# Patient Record
Sex: Female | Born: 1945
Health system: Southern US, Community
[De-identification: ages and names within clinical notes are randomized; demographics above are authoritative.]

## PROBLEM LIST (undated history)

## (undated) DIAGNOSIS — Z79811 Long term (current) use of aromatase inhibitors: Secondary | ICD-10-CM

## (undated) DIAGNOSIS — Z9221 Personal history of antineoplastic chemotherapy: Secondary | ICD-10-CM

## (undated) DIAGNOSIS — Z923 Personal history of irradiation: Secondary | ICD-10-CM

## (undated) DIAGNOSIS — C50919 Malignant neoplasm of unspecified site of unspecified female breast: Secondary | ICD-10-CM

## (undated) DIAGNOSIS — R05 Cough: Secondary | ICD-10-CM

## (undated) DIAGNOSIS — R059 Cough, unspecified: Secondary | ICD-10-CM

## (undated) DIAGNOSIS — M1990 Unspecified osteoarthritis, unspecified site: Secondary | ICD-10-CM

## (undated) DIAGNOSIS — K219 Gastro-esophageal reflux disease without esophagitis: Secondary | ICD-10-CM

## (undated) DIAGNOSIS — E785 Hyperlipidemia, unspecified: Secondary | ICD-10-CM

## (undated) HISTORY — DX: Long term (current) use of aromatase inhibitors: Z79.811

## (undated) HISTORY — PX: BREAST BIOPSY: SHX20

## (undated) HISTORY — DX: Personal history of irradiation: Z92.3

## (undated) HISTORY — DX: Cough: R05

## (undated) HISTORY — PX: OTHER SURGICAL HISTORY: SHX169

## (undated) HISTORY — DX: Hyperlipidemia, unspecified: E78.5

## (undated) HISTORY — PX: OOPHORECTOMY: SHX86

## (undated) HISTORY — PX: CHOLECYSTECTOMY: SHX55

## (undated) HISTORY — DX: Gastro-esophageal reflux disease without esophagitis: K21.9

## (undated) HISTORY — DX: Unspecified osteoarthritis, unspecified site: M19.90

## (undated) HISTORY — PX: ERCP: SHX60

## (undated) HISTORY — DX: Cough, unspecified: R05.9

## (undated) HISTORY — DX: Malignant neoplasm of unspecified site of unspecified female breast: C50.919

---

## 2000-02-15 ENCOUNTER — Other Ambulatory Visit: Admission: RE | Admit: 2000-02-15 | Discharge: 2000-02-15 | Payer: Self-pay | Admitting: Gynecology

## 2000-08-31 ENCOUNTER — Encounter (INDEPENDENT_AMBULATORY_CARE_PROVIDER_SITE_OTHER): Payer: Self-pay | Admitting: Specialist

## 2000-08-31 ENCOUNTER — Observation Stay (HOSPITAL_COMMUNITY): Admission: RE | Admit: 2000-08-31 | Discharge: 2000-09-01 | Payer: Self-pay | Admitting: Surgery

## 2001-05-25 ENCOUNTER — Other Ambulatory Visit: Admission: RE | Admit: 2001-05-25 | Discharge: 2001-05-25 | Payer: Self-pay | Admitting: Gynecology

## 2002-05-27 ENCOUNTER — Other Ambulatory Visit: Admission: RE | Admit: 2002-05-27 | Discharge: 2002-05-27 | Payer: Self-pay | Admitting: Gynecology

## 2003-06-19 ENCOUNTER — Other Ambulatory Visit: Admission: RE | Admit: 2003-06-19 | Discharge: 2003-06-19 | Payer: Self-pay | Admitting: Gynecology

## 2003-06-27 ENCOUNTER — Encounter: Payer: Self-pay | Admitting: Gynecology

## 2004-06-21 ENCOUNTER — Other Ambulatory Visit: Admission: RE | Admit: 2004-06-21 | Discharge: 2004-06-21 | Payer: Self-pay | Admitting: Gynecology

## 2004-07-01 ENCOUNTER — Encounter: Admission: RE | Admit: 2004-07-01 | Discharge: 2004-07-01 | Payer: Self-pay | Admitting: Gynecology

## 2005-06-30 ENCOUNTER — Other Ambulatory Visit: Admission: RE | Admit: 2005-06-30 | Discharge: 2005-06-30 | Payer: Self-pay | Admitting: Gynecology

## 2005-07-04 ENCOUNTER — Encounter: Admission: RE | Admit: 2005-07-04 | Discharge: 2005-07-04 | Payer: Self-pay | Admitting: Gynecology

## 2005-11-24 ENCOUNTER — Emergency Department (HOSPITAL_COMMUNITY): Admission: EM | Admit: 2005-11-24 | Discharge: 2005-11-24 | Payer: Self-pay | Admitting: Emergency Medicine

## 2005-11-25 ENCOUNTER — Ambulatory Visit: Payer: Self-pay | Admitting: Internal Medicine

## 2005-11-25 ENCOUNTER — Inpatient Hospital Stay (HOSPITAL_COMMUNITY): Admission: AD | Admit: 2005-11-25 | Discharge: 2005-11-26 | Payer: Self-pay | Admitting: Internal Medicine

## 2005-11-28 ENCOUNTER — Ambulatory Visit: Payer: Self-pay | Admitting: *Deleted

## 2005-11-28 ENCOUNTER — Ambulatory Visit (HOSPITAL_COMMUNITY): Admission: RE | Admit: 2005-11-28 | Discharge: 2005-11-28 | Payer: Self-pay | Admitting: *Deleted

## 2005-11-29 ENCOUNTER — Ambulatory Visit: Payer: Self-pay | Admitting: *Deleted

## 2006-06-28 ENCOUNTER — Ambulatory Visit: Payer: Self-pay | Admitting: Gastroenterology

## 2006-07-04 ENCOUNTER — Ambulatory Visit (HOSPITAL_COMMUNITY): Admission: RE | Admit: 2006-07-04 | Discharge: 2006-07-04 | Payer: Self-pay | Admitting: Gastroenterology

## 2006-07-04 ENCOUNTER — Ambulatory Visit: Payer: Self-pay | Admitting: Gastroenterology

## 2006-07-10 ENCOUNTER — Encounter: Admission: RE | Admit: 2006-07-10 | Discharge: 2006-07-10 | Payer: Self-pay | Admitting: Gynecology

## 2006-07-12 ENCOUNTER — Ambulatory Visit (HOSPITAL_COMMUNITY): Admission: RE | Admit: 2006-07-12 | Discharge: 2006-07-12 | Payer: Self-pay | Admitting: Gastroenterology

## 2006-07-18 ENCOUNTER — Ambulatory Visit: Payer: Self-pay | Admitting: Gastroenterology

## 2006-07-18 ENCOUNTER — Ambulatory Visit (HOSPITAL_COMMUNITY): Admission: RE | Admit: 2006-07-18 | Discharge: 2006-07-18 | Payer: Self-pay | Admitting: Gastroenterology

## 2006-07-18 ENCOUNTER — Encounter (INDEPENDENT_AMBULATORY_CARE_PROVIDER_SITE_OTHER): Payer: Self-pay | Admitting: *Deleted

## 2006-07-24 ENCOUNTER — Other Ambulatory Visit: Admission: RE | Admit: 2006-07-24 | Discharge: 2006-07-24 | Payer: Self-pay | Admitting: Gynecology

## 2006-07-25 ENCOUNTER — Inpatient Hospital Stay (HOSPITAL_COMMUNITY): Admission: EM | Admit: 2006-07-25 | Discharge: 2006-07-28 | Payer: Self-pay | Admitting: Emergency Medicine

## 2006-12-04 ENCOUNTER — Ambulatory Visit: Payer: Self-pay | Admitting: Gastroenterology

## 2007-07-23 ENCOUNTER — Encounter: Admission: RE | Admit: 2007-07-23 | Discharge: 2007-07-23 | Payer: Self-pay | Admitting: Gynecology

## 2007-07-25 ENCOUNTER — Encounter: Admission: RE | Admit: 2007-07-25 | Discharge: 2007-07-25 | Payer: Self-pay | Admitting: Gynecology

## 2007-08-06 ENCOUNTER — Other Ambulatory Visit: Admission: RE | Admit: 2007-08-06 | Discharge: 2007-08-06 | Payer: Self-pay | Admitting: Gynecology

## 2007-12-14 ENCOUNTER — Ambulatory Visit (HOSPITAL_COMMUNITY): Admission: RE | Admit: 2007-12-14 | Discharge: 2007-12-14 | Payer: Self-pay | Admitting: Family Medicine

## 2008-07-28 ENCOUNTER — Encounter: Admission: RE | Admit: 2008-07-28 | Discharge: 2008-07-28 | Payer: Self-pay | Admitting: Internal Medicine

## 2009-07-30 ENCOUNTER — Encounter: Admission: RE | Admit: 2009-07-30 | Discharge: 2009-07-30 | Payer: Self-pay | Admitting: Gynecology

## 2010-08-05 ENCOUNTER — Encounter: Admission: RE | Admit: 2010-08-05 | Discharge: 2010-08-05 | Payer: Self-pay | Admitting: Gynecology

## 2011-04-01 NOTE — Op Note (Signed)
NAMEKEMPER, HOCHMAN             ACCOUNT NO.:  [id number]   MEDICAL RECORD NO.:  [id number]          PATIENT TYPE:  AMB   LOCATION:  DAY                           FACILITY:  [facility]   PHYSICIAN:  Kassie Mends, M.D.      DATE OF BIRTH:  04-09-1946   DATE OF PROCEDURE:  07/18/2006  DATE OF DISCHARGE:                                 OPERATIVE REPORT   INDICATION FOR EXAM:  [name] is a 65 year old female with recurrent  post prandial chest pain, epigastric pain, and intermittent diarrhea.   PROCEDURE:  Esophagogastroduodenoscopy with cold forceps biopsies and Bravo  capsule placement.   FINDINGS:  1. Normal esophagus without evidence of Barrett's esophagus.  2. 2-3 cm hiatal hernia without evidence of Cameron ulcers.  Normal      stomach and pylorus and duodenum.  Biopsies obtained to evaluate for      celiac sprue.  3. Scope was withdrawn and Bravo capsule placed at approximately 26-28 cm      from the teeth.  Scope advanced into the esophagus to confirm placement      of the Bravo capsule.   RECOMMENDATIONS:  1. The patient will return in 48 hours to have the Bravo study read.  If      she experiences no chest pain in the next 48 hours the Bravo study will      be continued for another 48 hours.  She is asked to continue her      Protonix 40 mg daily.  2. She will follow-up appointment to see me in 2 weeks.  We will discuss      the biopsies and Bravo results at that time.   MEDICATIONS:  1. Demerol 75 mg IV.  2. Versed 4 mg IV.   PROCEDURE TECHNIQUE:  Physical exam was performed and informed consent was  obtained from the patient after explaining the benefits, risks and  alternatives to the procedure.  The patient was connected to monitor placed  in left lateral position.  The continuous oxygen was provided by nasal  cannula IV medicine administered through an indwelling cannula.  After  administration of sedation, the patient's esophagus intubated.  The scope  was  passed under direct visualization to the second portion of the duodenum.  The scope was subsequently removed slowly by  carefully examining color, texture, anatomy integrity of mucosa on the way  out.  After placement of the Bravo capsule, the scope was advanced into the  esophagus to confirm placement of the Bravo capsule.  The scope was  withdrawn, and the patient was recovered in the endoscopy suite and  discharged to home in satisfactory condition.      Kassie Mends, M.D.  Electronically Signed     SM/MEDQ  D:  07/18/2006  T:  07/18/2006  Job:  [id number]   cc:   Madelin Rear. Sherwood Gambler, MD  Fax: [phone]

## 2011-04-01 NOTE — Procedures (Signed)
NAMECATERIN, [name]             ACCOUNT NO.:  [id number]   MEDICAL RECORD NO.:  [id number]          PATIENT TYPE:  OUT   LOCATION:  RAD                           FACILITY:  [facility]   PHYSICIAN:  Thomas C. Wall, M.D.   DATE OF BIRTH:  Feb 27, 1946   DATE OF PROCEDURE:  DATE OF DISCHARGE:                                  ECHOCARDIOGRAM   REASON FOR VISIT:  The echocardiogram is ordered because of chest pain  (429.2).   CONCLUSION:  1.  Mild left atrial enlargement.  2.  Normal left ventricular chamber size and overall systolic function.  EF      55-65%.  3.  No left ventricular hypertrophy or regional wall motion abnormalities.  4.  Normal mitral valve.  5.  Normal aortic valve.  6.  Normal right sided structure.  7.  No pericardial effusion.      Thomas C. Wall, M.D.  Electronically Signed     TCW/MEDQ  D:  11/28/2005  T:  11/29/2005  Job:  [id number]   cc:   Madelin Rear. Sherwood Gambler, MD  Fax: [phone]   Pricilla Riffle, M.D.  [phone] [address]  [address]  [place]  [place] [address]

## 2011-04-01 NOTE — H&P (Signed)
[name], [name]             ACCOUNT NO.:  [id number]   MEDICAL RECORD NO.:  [id number]          PATIENT TYPE:  INP   LOCATION:  A310                          FACILITY:  [facility]   PHYSICIAN:  Kirk Ruths, M.D.DATE OF BIRTH:  1946-06-16   DATE OF ADMISSION:  07/25/2006  DATE OF DISCHARGE:  LH                                HISTORY & PHYSICAL   CHIEF COMPLAINT:  Chest pain, nausea and vomiting.   HISTORY OF PRESENT ILLNESS:  This is a 65 year old female who has been  having intermittent episodes of similar type pain the last several months.  She has been undergoing a workup by Dr. Cira Servant for GI which has up to now  showed only a hiatal hernia. On this occasion, the patient states pain is  severe, in the left chest radiating to both scapulae associated with nausea  and vomiting. In the emergency room, the patient was treated with Demerol  with relief of her pain. The patient also was noted to have some significant  elevations in her liver function tests. The patient's bilirubin is 1.5,  alkaline phosphatase 165, SGOT 546, SGPT 392. The patient's amylase and  lipase were normal. The patient is admitted for further evaluation and  control of her pain. Incidentally, the patient has undergone a cardiac  workup recently by Dr. Dorethea Clan which was negative for any kind of coronary  artery disease.   PAST MEDICAL HISTORY:  The patient current takes Lipitor, Protonix, aspirin,  and she is on Bactrim. She is status post cholecystectomy and oophorectomy  and has a history of hyperlipidemia.   REVIEW OF SYSTEMS:  Denies diarrhea or blood vomitus or bloody stools.   SOCIAL HISTORY:  She is a non-smoking, non-drinking, non-drug abusing lady.   PHYSICAL EXAMINATION:  VITAL SIGNS:  She is afebrile. Her blood pressure is  150/80, pulse is 100 and regular, respirations 18 and unlabored. O2  saturations are normal.  HEENT:  TMs are normal. Pupils are equal and reactive to accommodation.  Oropharynx benign.  NECK:  Supple without JVD, bruit or thyromegaly.  LUNGS:  Clear in all areas.  HEART:  Regular rate and rhythm without murmur, gallop or rub.  ABDOMEN:  At the time of this exam is benign without tenderness or masses.  Normal bowel sounds.  EXTREMITIES:  Without clubbing, cyanosis, or edema.  NEUROLOGICAL:  Grossly intact.   ASSESSMENT:  1. Abdominal pain. Possible common duct stone.  2. Status post cholecystectomy.  3. Hyperlipidemia.      Kirk Ruths, M.D.  Electronically Signed     WMM/MEDQ  D:  07/25/2006  T:  07/25/2006  Job:  [id number]

## 2011-04-01 NOTE — Op Note (Signed)
[facility]  Patient:    [name], [name]                    MRN: [id number] Proc. Date: 08/31/00 Adm. Date:  [id number] Attending:  Charlton Haws CC:         Gretta Cool, M.D.  Dr. Audie Pinto    Operative Report  CCS [phone].  PREOPERATIVE DIAGNOSIS:  Chronic calculous cholecystitis.  POSTOPERATIVE DIAGNOSIS:  Chronic calculous cholecystitis.  PROCEDURE:  Laparoscopic cholecystectomy.  SURGEON:  Currie Paris, M.D.  ASSISTANT:  Sandria Bales. Ezzard Standing, M.D.  ANESTHESIA:  General endotracheal.  CLINICAL HISTORY:  This patient is a 65 year old with biliary-type symptoms, gallstones found on ultrasound.  DESCRIPTION OF PROCEDURE:  The patient was brought to the operating room. After satisfactory general endotracheal anesthesia had been obtained, the abdomen was prepped and draped.  We used 0.25% Marcaine at each incision site. An umbilical incision was made, the fascia opened, and the peritoneal cavity opened.  A pursestring was placed and the Hasson introduced.  The abdomen was insufflated to 15.  No gross abnormalities were noted on 360 degree exam of the abdomen.  The patient was placed in reverse Trendelenburg, and three additional cannulas were placed under direct vision.  The gallbladder was retracted over the liver and dissection begun around the cystic duct.  The artery overlying was a little anterior to the cystic duct and had to be dissected off of it.  It was clipped, as well as a small branch of it was also clipped.  The duct was dissected out so that I could see its junction clearly with the gallbladder.  Traced it down toward the common duct, and I could see the common duct going superiorly, so I did not dissect this out any further. The patient had a normal common duct on ultrasound and normal liver functions, so no cholangiogram was done.  I opened up the peritoneum so I had a big window behind the cystic  duct to make sure there were no other structures back there, and the anatomy was clearly identified.  The cystic duct was clipped with two clips on the stay side and one on the go side and divided.  Another small branch of the cystic artery was clipped and divided.  The gallbladder was removed from below to above and just prior to this, we irrigated to make sure everything was dry.  The gallbladder was _____ and brought out the umbilical port.  The abdomen was reinflated and a final check made for hemostasis, and our irrigant suctioned out.  The lateral ports were removed, and there was no bleeding.  The umbilical port was closed with a pursestring and looked intact, followed by the camera position at the epigastric port. Air was deflated through the epigastric port.  All skin incisions were closed with 4-0 Monocryl subcuticular plus Steri-Strips.  The patient tolerated the procedure well.  There were no intraoperative complications.  All counts were correct. DD:  08/31/00 TD:  08/31/00 Job: [id number]

## 2011-04-01 NOTE — Op Note (Signed)
NAME:  [name], [name]             ACCOUNT NO.:  [id number]   MEDICAL RECORD NO.:  [id number]          PATIENT TYPE:  INP   LOCATION:  A310                          FACILITY:  [facility]   PHYSICIAN:  R. Roetta Sessions, M.D. DATE OF BIRTH:  1946/07/26   DATE OF PROCEDURE:  07/25/2006  DATE OF DISCHARGE:                                 OPERATIVE REPORT   PROCEDURE:  ERCP.   INDICATIONS FOR PROCEDURE:  Patient is a 65 year old lady with atypical  chest and abdominal pain over the past several months.  She came in the  hospital with acute exacerbation.  Her bile duct now is markedly dilated.  Her transaminases went up into the 800 range.  Ultrasound suggests a common  bile duct stone.  ERCP is now being done.  This approach has been discussed  with the patient at length.  Potential risks, benefits, and alternatives  have been reviewed.  We talked about a 1/10 chance of pancreatitis, reaction  to medications, perforation, bleeding, potential for failed procedure, all  questions were answered and she is agreeable.  Procedure was performed under  endotracheal anesthesia in the OR.  Patient placed in a semi-prone position.  One dose of Unasyn 5 g was given IV prior to the procedure.   INSTRUMENT:  Olympus video chip system.   FINDINGS:  __________tubular esophagus incidentally noted.  The Brava pH  probe attached to the distal esophagus.   Examination of the stomach revealed no abnormalities.  Examination of the  second portion of the duodenum revealed three large duodenal diverticulum,  the ampulla is not apparent.  I pulled the scope back to the 55 cm mark.  Scout film was taken.  It took some time to find the ampulla.  It was on the  left rim of the inferior tick.  __________ done in this area and was a very  difficult procedure.  I gave the patient 0.5 mg of IV glucagon.  I  eventually was able to tease out the ampulla but it was only teetering on  the rim and a very hard access, every  time I approached it, the catheter  pushed it back into the large duodenal tick out of sight.  I was able to get  a partial cholangiogram.  The patient had distal common bile duct stones and  distal stricture is not excluded.  I was unable after multiple attempts to  advance even a 0.018 guidewire across the area of obstruction/filling  defects.  I spent a long time trying to do so.  The anatomy was working  against me.  The pancreatic duct was not opacified. The patient tolerated  the procedure very well.   IMPRESSION:  Distal common bile duct obstruction with filling defects, most  likely representing stones, three large duodenal diverticulum made access  approach very difficult, unable to achieve deep cannulation or placement of  the guidewire.   RECOMMENDATIONS:  1. I have called Dr. Achilles Dunk at [facility] who has kindly      agreed to get the patient over there on July 27, 2006, for second  attempt at endoscopic biliary decompression.  This approach has been      discussed with patient and family members.  All parties are agreeable.      Will allow a clear liquid diet, check labs tomorrow.  2. Continue IV Unasyn 1.5 g q.6h.      Jonathon Bellows, M.D.  Electronically Signed     RMR/MEDQ  D:  07/25/2006  T:  07/26/2006  Job:  [id number]   cc:   Kirk Ruths, M.D.  Fax: [phone]

## 2011-04-01 NOTE — Discharge Summary (Signed)
NAMEABBYGAEL, [name]             ACCOUNT NO.:  [id number]   MEDICAL RECORD NO.:  [id number]          PATIENT TYPE:  INP   LOCATION:  A310                          FACILITY:  [facility]   PHYSICIAN:  Kirk Ruths, M.D.DATE OF BIRTH:  12/26/1945   DATE OF ADMISSION:  07/24/2006  DATE OF DISCHARGE:  09/14/2007LH                                 DISCHARGE SUMMARY   DISCHARGE DIAGNOSES:  1. Common bile duct stone.  2. Elevated liver enzymes secondary to number one.  3. Status post sphincterotomy.   HOSPITAL COURSE:  This is a 65 year old female who is status post  gallbladder surgery and has continued to have intermittent abdominal pain  and vomiting.  She has been worked up on numerous occasions by GI.  On this  occasion she had prolonged severe pain in her chest radiating to above her  scapula with nausea and vomiting.  In the emergency room she was found to  have elevated liver function tests significantly.  The patient had recently  undergone a cardiac workup, which was negative.  It was felt that the  patient's pain and nausea were relieved with IV medications with resolution  of her symptoms.  Her liver function tests quickly started improving towards  normal and it was felt she possibly had a common bile duct stone.   The patient was seen by GI and an ultrasound showed intra- and extrahepatic  biliary dilatation with a question of choledocholithiasis.  The patient  underwent ERCP and EGD, and subsequently went to [facility] where she  underwent a sphincterotomy.  Multiple gallstones were noted.   The patient had total relief of the nausea and vomiting, and was tolerating  a regular diet.   DISPOSITION AND DISCHARGE MEDICATIONS:  The patient will discharged home on  Lipitor and Protonix as well as a low-fat diet.   DISCHARGE INSTRUCTIONS:  The patient is to hold her aspirin and NSAIDs for a  week.   FOLLOW UP:  The patient is follow up as needed.      Kirk Ruths, M.D.  Electronically Signed     WMM/MEDQ  D:  07/28/2006  T:  07/28/2006  Job:  [id number]

## 2011-04-01 NOTE — Procedures (Signed)
NAMEJONIYA, [name] NO.:  [id number]   MEDICAL RECORD NO.:  [id number]          PATIENT TYPE:  OUT   LOCATION:  RAD                           FACILITY:  [facility]   PHYSICIAN:  Vida Roller, M.D.   DATE OF BIRTH:  [birth year]   DATE OF PROCEDURE:  11/28/2005  DATE OF DISCHARGE:                                    STRESS TEST   HISTORY:  [name] is a 65 year old female with no known coronary  artery disease with atypical chest discomfort.  She was admitted to [facility] over the weekend and ruled out for acute myocardial infarction  with serial enzymes.  Her cardiac risk factors include dyslipidemia.   BASELINE DATA:  Electrocardiogram reveals sinus rhythm at 79 beats per  minute, nonspecific ST abnormalities.  Blood pressure is 142/80.   [name] walked for 6 minutes and 33 seconds, Bruce protocol stage II  and 7.0 METS.  Maximal heart rate was 168 which is 104% of predicted  maximum.  Maximum blood pressure is 188/82 and resolved down to 150/78 in  recovery.  The patient reported shortness of breath with exercise that  resolved in recovery.  No chest discomfort was noted.  EKG revealed few PVCs  and some upsloping ST depression was noted and no ischemic changes.   Final images and results are pending MD review.      Jae Dire, P.A. [facility]      Vida Roller, M.D.  Electronically Signed    AB/MEDQ  D:  11/28/2005  T:  11/29/2005  Job:  [id number]

## 2011-04-01 NOTE — Consult Note (Signed)
NAMEKATHREN, SCEARCE NO.:  [id number]   MEDICAL RECORD NO.:  [id number]          PATIENT TYPE:  INP   LOCATION:  A213                          FACILITY:  [facility]   PHYSICIAN:  Pricilla Riffle, M.D.    DATE OF BIRTH:  [birth year]   DATE OF CONSULTATION:  11/25/2005  DATE OF DISCHARGE:                                   CONSULTATION   REASON FOR CONSULTATION:  Ms. Sliwinski is a 65 year old female with past  medical history significant for gastroesophageal reflux disorder and mild  dyslipidemia who presented to [facility] yesterday for evaluation  of chest pain.  She was sitting in a waiting office with her sister at rest  when she noted onset of epigastric/lower chest pain that radiated up into  her left shoulder and down to her left elbow.  She denied any associated  shortness of breath, nausea, vomiting or diaphoresis.  She states that this  discomfort lasted approximately 30-45 minutes.  She did take a Zantac  approximately 15 minutes after onset of discomfort.  She is recommended by  the physician's office that she was in that she be evaluated in the  emergency department.  At [facility], she was evaluated with point of  care markers that were negative x3.  She was given four baby aspirin.  It  was recommended that she be admitted overnight for further evaluation,  however, the patient refused.  She did, however, followup with her primary  care physician today while she was at his office.  Again, at rest, she had  onset of same discomfort in her epigastric region and lower chest area.  She  denied any radiation to her left arm today.  She states discomfort lasted  approximately 10-15 minutes.  She denied any associated symptoms today.  She  does state that this discomfort feels similar to her pain that she had prior  to her cholecystectomy without the nausea and vomiting.   PAST MEDICAL HISTORY:  1.  Gastroesophageal reflux disease.  2.  Mild  dyslipidemia.  3.  Status post cholecystectomy in 2001.   ALLERGIES:  CODEINE.   MEDICATIONS PRIOR TO ADMISSION:  1.  Osteo Bi-Flex p.r.n.  2.  Lipitor 5 mg nightly.  3.  Prometrium 200 mg daily x12 days every third month.  4.  Boniva 150 mg x1 each month.  5.  Vivelle estradiol patch 0.05 mg two times per week.  6.  Aspirin 81 mg daily.  7.  Testosterone cream daily to the inside of her leg.  8.  Vitamin E daily.   SOCIAL HISTORY:  Ms. Corbridge lives in [place] with her husband.  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  She has one child who is grown.  She denies any history of tobacco abuse, alcohol use or illicit drug use.   FAMILY HISTORY:  Mother is deceased at 75 years old secondary to MI.  Father  is deceased at unknown age with cirrhosis.  She has two sisters, one with  history of breast cancer at age 51 and one who is healthy.  REVIEW OF SYSTEMS:  CONSTITUTIONAL:  No fevers or chills.  HEENT:  Mild  nasal drainage and occasional vertigo over the last several months.  SKIN:  No rashes or lesions.  CARDIOPULMONARY:  Dyspnea on exertion x8 years with  no worsening recently.  She states things such as mopping the floor and  vacuuming make her short of breath.  She does have occasional dependent  edema with no orthopnea, peripheral edema or palpitations.  She denies any  syncope or presyncope.  She does have a dry cough currently.  GENITOURINARY:  No frequency or dysuria.  NEUROLOGIC:  No weakness or numbness.  No  depression or anxiety.  She has some arthralgias in her left hip that has  been relieved with Osteo Bi-Flex.  No myalgias.  GASTROINTESTINAL:  No  nausea, vomiting, diarrhea, bright red blood per rectum, no melena or  hematemesis.  She has had some gastroesophageal reflux disease symptoms  including a bad taste in her mouth and increased belching recently.  All  other systems reviewed are negative except per HPI.   PHYSICAL EXAMINATION:  VITAL SIGNS:   Temperature 97.6, pulse 70,  respirations 18, blood pressure 140/80, weight 164.  GENERAL:  Well-developed, well-nourished female in no acute distress.  HEENT:  Normocephalic, atraumatic.  Pupils equal round and reactive to  light.  NECK:  No bruits.  No jugular venous distention.  No lymphadenopathy.  CARDIAC:  Regular rate and rhythm.  No murmurs appreciated.  LUNGS:  Clear.  SKIN:  Warm and dry with no rashes.  BREASTS:  Deferred.  ABDOMEN:  Soft, nontender with active bowel sounds.  No rebound or guarding  is noted.  No hepatosplenomegaly is noted.  GENITALIA:  Deferred.  RECTAL:  Deferred.  EXTREMITIES:  No clubbing, cyanosis or edema.  Distal pulses intact  bilaterally.  MUSCULOSKELETAL:  No joint deformity.  NEUROLOGIC:  She is alert and oriented x3 and cranial nerves are grossly  intact.   LABORATORY DATA AND X-RAY FINDINGS:  Chest x-ray with no active  cardiopulmonary disease.  Electrocardiogram with sinus rhythm at 65 beats  per minute with normal axis and normal intervals.  No ischemic changes  noted.   White blood cells 5.6, hemoglobin 14, hematocrit 40, platelets 198.  Sodium  is 137, potassium 3.6, chloride 101, bicarb 25, glucose 105, BUN 17,  creatinine 1.0.  Total bilirubin 0.8, Alk phos 58, AST 34, ALT 27, total  protein 7.4, albumin 4.1, calcium 9.0.  D-dimer 0.33.  Cardiac enzymes are  negative x1 for acute myocardial infarction.  Point of care markers  yesterday in the emergency department were negative x3.   IMPRESSION:  Chest discomfort/epigastric discomfort.  This is atypical for  coronary artery disease.  Cardiac enzymes are negative x1 for acute  myocardial infarction.  D-dimer is within normal limits.  Electrocardiogram  reveals no acute ischemic changes.   RECOMMENDATIONS:  Serial cardiac markers to rule out acute myocardial  infarction.  If she does rule out by serial enzymes, she could be discharged with treatment with aspirin daily and  nitroglycerin as needed for chest  pain.  She would return for a stress Myoview which will be scheduled for  Monday morning.  If her cardiac enzymes are abnormal, reassessment will be  performed on Monday.  The patient does also have symptoms of  gastroesophageal reflux, therefore she has been started on a proton pump  inhibitor and abdominal ultrasound has been ordered to rule out any common  bile duct stone.  The patient's case was discussed with Dr. Dietrich Pates who agrees with the  above assessment and plan.  Dr. Tenny Craw in turn discussed this with Dr. Sherwood Gambler  who is in agreement as well.   Thank you for the consult.      Amy Mercy Riding, P.A. [facility]    ______________________________  Pricilla Riffle, M.D.    AB/MEDQ  D:  11/25/2005  T:  11/25/2005  Job:  [id number]

## 2011-04-01 NOTE — Consult Note (Signed)
NAME:  [name], PERAGINE             ACCOUNT NO.:  [id number]   MEDICAL RECORD NO.:  [id number]          PATIENT TYPE:  INP   LOCATION:  A310                          FACILITY:  [facility]   PHYSICIAN:  R. Roetta Sessions, M.D. DATE OF BIRTH:  05-28-1946   DATE OF CONSULTATION:  07/25/2006  DATE OF DISCHARGE:                                   CONSULTATION   REFERRING PHYSICIAN:  Kirk Ruths, M.D.   REASON FOR CONSULTATION:  Abdominal pain.   HISTORY OF PRESENT ILLNESS:  The patient is a 65 year old, Caucasian female  with history of recurrent epigastric pain which radiates into her chest.  This has been ongoing since January 2007.  We saw her initially on June 28, 2006, for these symptoms.  At that time, she had a normal set of LFTs in  January, along with abdominal ultrasound which revealed a CBD diameter of 7  mm.  She is status post cholecystectomy 6 years ago.  Repeat abdominal  ultrasound in July 2007, revealed a CBD diameter of 9 mm.  She had reported  intermittent symptoms along with typical reflux symptoms for which she had  been on Protonix and noted some improvement.  She underwent EGD by Dr. Kassie Mends on July 04, 2006, which revealed a 2-3 cm hiatal hernia.  For chest  pain, she underwent esophageal manometry on July 12, 2006, which revealed  a 2-3 cm hiatal hernia.  For chest pain, she underwent esophageal manometry  on July 12, 2006, which revealed a nonspecific esophageal motility  disorder.  She had already had a negative cardiac workup.  On July 18, 2006, she had Bravo placed as well as a small bowel biopsy that was negative  for celiac disease.  Bravo results are pending.  She said Saturday evening,  she ate fried shrimp and steak and developed epigastric pain which radiated  into her chest.  It only lasted about 30 minutes, but was quite severe.  Yesterday, she had a mild attack, but later around 3 p.m., she developed  severe progressive epigastric  pain radiating to both shoulder blades  associated with nausea and vomiting which prompted her to come to the ED  around 10 p.m. last night.  She is noted to have elevated LFTs now which is  a new finding.  Her total bilirubin was 1.5, Alk phos 165, AST 546, ALT 392.  Amylase and lipase were normal.  Cardiac enzymes were negative x1.  She is  feeling better this morning.  Her abdominal pain is almost resolved.  She  denies any further vomiting.  Over the past week, she has had some loose  stools along with the abdominal pain.  No melena or rectal bleeding noted.   MEDICATIONS AT HOME:  1. Lipitor 5 mg daily.  2. Protonix 40 mg daily.  3. Vitamin E 400 IU daily.  4. Fish oil 100 mg daily.  5. Citracal plus daily.  6. Aspirin 81 mg daily.  7. Testosterone 4% daily.  8. Prometrium 20 mg daily.  9. Mylanta p.r.n.   ALLERGIES:  CODEINE.  According to  E-chart, she is allergic to DEMEROL, but  the patient cannot remember any specific reactions.   PAST MEDICAL HISTORY:  As outlined above.  In addition, history of  hemorrhoids, diverticulosis, dyslipidemia, right mandibular cystic type  lesions being seen by Dr. Malvin Johns.   PAST SURGICAL HISTORY:  1. Status post cholecystectomy 6 years ago.  2. She had one of her ovaries removed and a cyst removed from the other.  3. Colonoscopy in September 2006, in [place], revealing hemorrhoids and      diverticulosis.   FAMILY HISTORY:  Mother died at age 31 secondary to MI.  Father deceased at  age 72 secondary to alcoholic cirrhosis.  She has one sister who had breast  cancer at age 67.  There is no family history of colorectal cancer.   SOCIAL HISTORY:  She is married with one grown daughter.  She is self-  employed/owns an antique shop.  She has never been a smoker, no alcohol use.   REVIEW OF SYSTEMS:  GASTROINTESTINAL:  See HPI.  CONSTITUTIONAL:  Modest  weight loss of approximately 5 pounds.  CARDIOPULMONARY:  No shortness of   breath.   PHYSICAL EXAMINATION:  VITAL SIGNS:  Temperature 99.4, pulse 101,  respirations 20, blood pressure 132/72, weight 168.8, height 64 inches.  GENERAL:  A pleasant, well-developed, well-nourished, Caucasian female in no  acute distress.  SKIN:  Warm and dry, no jaundice.  HEENT:  Sclerae nonicteric.  Oropharyngeal mucosa moist and pink.  No  lesions, erythema or exudate.  No lymphadenopathy or thyromegaly.  CHEST:  Lungs clear to auscultation.  CARDIAC:  Regular rate and rhythm, normal S1, S2, no murmurs, rubs or  gallops.  ABDOMEN:  Positive bowel sounds, distended.  She has mild epigastric and  right upper quadrant tenderness with deep palpation.  No rebound, tenderness  or guarding.  No abdominal bruits or hernias.  EXTREMITIES:  No edema.   LABORATORY DATA AND X-RAY FINDINGS:  As mentioned in HPI.  In addition,  white count 7100, hemoglobin 14.4, platelets 210,000.  Sodium 137, potassium  4, BUN 19, creatinine 1, glucose 118.   IMPRESSION:  Ms. Pinkett is a 65 year old lady with an 31-month history of  intermittent epigastric pain which radiates into her chest.  Workup as  outlined above.  Now, she has new elevated LFTs with recurrent, severe  abdominal pain.  We suspect we are dealing with choledocholithiasis,  however, cannot exclude papillary stenosis or sphincter of Oddi dysfunction.   RECOMMENDATIONS:  1. Repeat abdominal ultrasound now.  2. Repeat LFTs, amylase and lipase.  3. Will go ahead and check PT/INR and bleeding time as I suspect she will      be having ERCP.  4. Remain n.p.o. for now.   I would like to thank Dr. Yetta Numbers for allowing [place] to take part in the  care of this patient.      Tana Coast, P.AJonathon Bellows, M.D.  Electronically Signed    LL/MEDQ  D:  07/25/2006  T:  07/25/2006  Job:  [id number]   cc:   R. Roetta Sessions, M.D.  [id number]  [place]  [place] [address]

## 2011-04-01 NOTE — Op Note (Signed)
[name], [name]             ACCOUNT NO.:  [id number]   MEDICAL RECORD NO.:  [id number]          PATIENT TYPE:  AMB   LOCATION:  DAY                           FACILITY:  [facility]   PHYSICIAN:  Kassie Mends, M.D.      DATE OF BIRTH:  1945-12-14   DATE OF PROCEDURE:  07/12/2006  DATE OF DISCHARGE:  07/12/2006                                 OPERATIVE REPORT   PROCEDURE:  Esophageal manometry.   REFERRING PHYSICIAN:  Kassie Mends, M.D.   INDICATIONS:  The patient is a 65 year old female with intermittent severe  epigastric chest pain. Cardiac workup has been negative. EGD on July 04, 2006 by Dr. Kassie Mends revealed a 2- to 3-cm hiatal hernia but otherwise  normal study.   METHOD:  The esophageal manometry study was performed using [employer]  Medical Gastrosoft Upper Gastrointestinal Polygram, version 6.40, using an  Arndorfer infusion system at 0.5 cc/min. The lower esophageal sphincter  (LES) was identified in four ports with the standard station pullthrough  technique. For esophageal body pressures, the tip of the catheter was placed  3 cm above the proximal aspect of the LES. Ten wet swallows were taken with  ports 5 cm apart in the esophageal body. This procedure was reviewed with  the patient prior to performing it.   FINDINGS:  Esophageal body amplitude (mmHg):  113.2 at 13 cm (normal 38-  102), 16.5 at 8 cm (normal 49-131), 69.5 at 3 cm (normal 64-154), 43 mean  (distal two ports) (normal 59-139).   Duration (seconds):  3.6 at 13 cm (normal 3-5), 5.5 at 8 cm (normal 3-5),  6.5 at 3 cm (normal 2.5-3.5), 6 mean (distal two ports) (normal 3.5).   Contractions (with wet swallows):  40% peristaltic, 40% were low amplitude  but did show peristalsis, 2 were simultaneous.   Lower esophageal sphincter (LES):   Located at 43 to 40 cm (from nares).  Resting pressure:  10 mmHg.  Relaxation:  Complete.   IMPRESSION:  Nonspecific esophageal motility disorder. She had evidence  of  good peristalsis. LES resting pressure borderline low normal.   RECOMMENDATION:  She is scheduled for pH study to further evaluate chest  pain as per Dr. Kassie Mends.      Tana Coast, P.A.      Kassie Mends, M.D.  Electronically Signed    LL/MEDQ  D:  07/14/2006  T:  07/15/2006  Job:  [id number]   cc:   Madelin Rear. Sherwood Gambler, MD  Fax: [phone]

## 2011-04-01 NOTE — Consult Note (Signed)
[name], [name]             ACCOUNT NO.:  [id number]   MEDICAL RECORD NO.:  [id number]          PATIENT TYPE:   LOCATION:                                 FACILITY:   PHYSICIAN:  Kassie Mends, M.D.      DATE OF BIRTH:  02-22-1946   DATE OF CONSULTATION:  06/28/2006  DATE OF DISCHARGE:                                   CONSULTATION   REASON FOR CONSULTATION:  Abdominal pain, chest pain.   REQUESTING PHYSICIAN:  Madelin Rear. Sherwood Gambler, M.D.   CONSULTING PHYSICIAN:  Kassie Mends, M.D.   HISTORY OF PRESENT ILLNESS:  The patient is a 65 year old Caucasian female  who presents with complaints of recurrent epigastric/chest pain which has  been occurring since January 2007.  Episodes occur approximately once every  two weeks but over the last month have become more frequent.  The initial  episode occurred while she was sitting in a waiting room while her sister  was having an x-ray.  She developed pain in the epigastrium which radiated  into her left chest and down her left arm.  She was advised by the doctor's  office to go the emergency department.  She was seen at [facility]  and point-of-care testing was negative for cardiac etiology.  She refused  admission but followed up with Dr. Sherwood Gambler.  She eventually was hospitalized  at [facility], underwent a cardiac consultation and stress test  which were unremarkable.  She has mild dyslipidemia for which she takes low  dose Lipitor.  It was felt probable that her symptoms were from a GI origin.  She was placed on Protonix b.i.d. in January.  She took about a one month  supply, feeling well and came off the medication.  She has taken 3 courses  of Protonix over the last 7-8 months.  She also uses Mylanta on a p.r.n.  basis.  She does have some typical heartburn type symptoms intermittently.  She has adjusted her diet eliminating greasy and spicy foods.  She has had 2  episodes of nocturnal abdominal/chest pain.  Last week  she had 2 episodes  within 3 days of each other.  She denies any associated shortness of breath.  She often has hot sweats which she relates to menopause.  Bowel movements  are very regular.  She rarely sees bright red blood per rectum but is known  to have hemorrhoids on colonoscopy done in September 2006, in [place].  She also was told she had diverticulosis.   CURRENT MEDICATIONS:  1. Lipitor 5 mg every day.  2. Protonix 40 mg b.i.d.  3. Vitamin E 400 I. U. every day.  4. Fish oil 1,000 mg every day.  5. Citracal Plus every day.  6. Aspirin 81 mg every day.  7. __________  0.25 mg two per week.  8. Testosterone 4% one daily.  9. Prometrium 200 mg daily, days 1-12 every three months.  10.Sulfamethoxazole 800/160 mg b.i.d.  11.Mylanta p.r.n.   ALLERGIES:  CODEINE.   PAST MEDICAL HISTORY:  1. Hemorrhoids.  2. Diverticulosis.  3. Dyslipidemia.  4. Right  mandibular cystic type lesion being seen by Dr. Malvin Johns.  5. Status post cholecystectomy six years ago.  6. She has had one of her ovaries removed and a cyst removed from the      other.  7. Colonoscopy in September 2006, as outlined above.   FAMILY HISTORY:  Mother died at age 66 secondary to MI.  Father deceased at  age 63 with alcoholic cirrhosis.  She had one sister who had breast cancer,  age 50.  No family history of colorectal cancer.   SOCIAL HISTORY:  She is married with 1 grown daughter.  Self-employed/owns  an antique shop.  Never been a smoker.  No alcohol use.   REVIEW OF SYSTEMS:  See HPI for GI.  CONSTITUTIONAL:  No weight loss.  CARDIOPULMONARY:  No shortness of breath.   PHYSICAL EXAMINATION:  VITAL SIGNS:  Weight 174.5, height 5 foot 6 inches,  temp 98.9, blood pressure 120/70, pulse 64.  GENERAL:  A pleasant, mildly  obese, Caucasian female in no acute distress. SKIN:  Warm and dry.  No  jaundice.  HEENT:  Sclerae are nonicteric.  Oropharyngeal mucosa is moist  and pink.  No lesions, erythema or  exudate.  No lymphadenopathy,  thyromegaly.  CHEST:  Lungs are clear to auscultation.  CARDIAC:  Reveals a regular rate  and rhythm.  Normal S1, S2.  No murmurs, rubs, or gallops.  ABDOMEN:  Positive bowel sounds.  Soft, nondistended.  She has mild epigastric  tenderness to deep palpation.  Abdomen is nontender.  No organomegaly or  masses.  No rebound tenderness or guarding.  No abdominal bruits or hernias.  EXTREMITIES:  No edema.   Abdominal ultrasound, November 25, 2005, reveals status post cholecystectomy,  common bile duct measures 7.6-mm proximally, mid to distal duct not seen  because of overlying gas.  Abdominal ultrasound done at [facility]  last month revealed a common bile duct of 9-mm.   The last labs available to me are from January 2007.  Her CBC was normal.  Her LFTs were normal.  H. pylori serologies were negative.   IMPRESSION:  [name] is a 65 year old lady with epigastric  pain/atypical chest pain with negative cardiac workup.  Symptoms are  sporadic and she is in the setting of typical reflux symptoms as well.  She  has noted improvement with Protonix when she on the medication, but she has  not been on it consistently over the last 6-7 months.  Suspect her symptoms  may be due to gastroesophageal reflux disease.  Interestingly, she is status  post cholecystectomy with a common bile duct diameter of 7.6-mm on  ultrasound in January and on a different imaging facility her common duct  was reported to be 9-mm.  The significance of this difference is unclear at  this time. In light of normal liver enzymes, and recent abdominal imaging,  her dilated CBD is not clinically significant.   PLAN:  1. EGD in the near future.  2. She will hold her aspirin for 4 days prior to procedure.  3. Continue Protonix 40 mg every day to b.i.d. #20 samples provided.  4. Further recommendations to follow.   IMPRESSION:      Tana Coast, P.A.      Kassie Mends,  M.D.  Electronically Signed    LL/MEDQ  D:  06/28/2006  T:  06/28/2006  Job:  [id number]   cc:   Madelin Rear. Sherwood Gambler, MD  Fax: [phone]

## 2011-04-01 NOTE — Op Note (Signed)
NAMEISELA, STANTZ             ACCOUNT NO.:  [id number]   MEDICAL RECORD NO.:  [id number]          PATIENT TYPE:  AMB   LOCATION:  DAY                           FACILITY:  [facility]   PHYSICIAN:  Kassie Mends, M.D.      DATE OF BIRTH:  09/15/1946   DATE OF PROCEDURE:  07/18/2006  DATE OF DISCHARGE:  07/18/2006                                 OPERATIVE REPORT   REFERRING PHYSICIAN:  Madelin Rear. Sherwood Gambler, M.D.   PROCEDURE:  A 48-hour pH study with Bravo device.   INDICATIONS FOR EXAMINATION:  Ms. Riecke is a 65 year old female who  presented with recurrent episodic epigastric and chest pain since January  2007.  She had a normal esophageal manometry and upper endoscopy.  She is  currently maintained on Protonix twice a day.   INTERPRETATION OF STUDY:  On day one, she had 54 total reflux episodes.  She  had 10 episodes of reflux that were greater than 5 minutes.  Her longest  episode of reflux was 42 minutes.  She spent 214 minutes at a pH of less  than 4.  She spent 18.2% of her time at a pH of less than 4.  Her DeMeester  score was 60.5, DeMeester normal is less than 14.2 (95th percentile).   Day two analysis:  She had 2 episodes of reflux.  No episode of reflux  lasted greater than 5 minutes.  Her longest episode of reflux lasted 1  minute.  She spent 1 minute at a pH of less than 4.  She spent 0.1% of her  time at a pH of less than 4.  Her DeMeester score was 0.6, DeMeester normal  is less than 14.72 (95th percentile).  Her symptom association probability  included heartburn 21.3 and chest pain 21.3.   Ms. Markin is a 65 year old female who upon questioning took her Protonix  later in the day on day 1 of the study.  She has definite evidence of  gastroesophageal reflux disease.  On day two she took her Protonix and shows  a very nice therapeutic response.  She has poor symptom correlation.  She  should remain on proton pump therapy indefinitely and continue to use  lifestyle  modification to treat herself symptomatically.      Kassie Mends, M.D.  Electronically Signed     SM/MEDQ  D:  08/12/2006  T:  08/14/2006  Job:  [id number]   cc:   Madelin Rear. Sherwood Gambler, MD  Fax: [phone]

## 2011-04-01 NOTE — H&P (Signed)
[name], [name]             ACCOUNT NO.:  [id number]   MEDICAL RECORD NO.:  [id number]          PATIENT TYPE:  EMS   LOCATION:  MAJO                         FACILITY:  [facility]   PHYSICIAN:  Madelin Rear. Sherwood Gambler, MD  DATE OF BIRTH:  October 15, 1946   DATE OF ADMISSION:  11/24/2005  DATE OF DISCHARGE:  LH                                HISTORY & PHYSICAL   CHIEF COMPLAINT:  Chest and epigastric pain.   HISTORY OF PRESENT ILLNESS:  The patient had rest onset of epigastric and  lower chest area pain while [place]  This lasted about 40-45 minutes.  She was brought to the  emergency room for further evaluation.  At that time, they recommended  admission.  However, the patient was loathe to admit and left without being  admitted.  She has had persistent achiness, although less severe in the  epigastric area.  There is some radiation to the left shoulder and left arm.  She states it also goes through to her back.  She denied any associated  symptoms of dyspnea, diaphoresis, syncope or palpitations.   PAST MEDICAL HISTORY:  Gastroesophageal reflux disease status post  cholecystectomy 2001.   SOCIAL HISTORY:  She does not smoke, drink alcohol or use any drugs.   FAMILY HISTORY:  Noncontributory.   REVIEW OF SYSTEMS:  Negative in detail as above.  All else negative.   PHYSICAL EXAMINATION:  SKIN:  Unremarkable.  HEENT:  No JVD or adenopathy.  NECK:  Supple.  Chest clear.  CARDIAC:  Regular rate and rhythm without murmurs, gallops, rubs.  ABDOMEN:  Soft, no organomegaly or masses.  No guarding or rebound  tenderness.  EXTREMITIES:  Without cyanosis, clubbing or edema.  NEUROLOGICAL:  Nonfocal.   STUDIES:  Electrocardiogram in the office reveals normal sinus rhythm with  no acute ischemia or infarction identified, in fact, was within normal  limits.  The remainder of her laboratories are pending hospital admission.  Will be reviewed  when available.   IMPRESSION:  1.  Unexplained chest pain, middle aged female.  The patient will be      admitted for rule out MI protocol.  Cardiology consultation will be      sought.  2.  Status post cholecystectomy with epigastric pain.  This could be a      common duct stone versus esophageal      spasm versus pancreatitis.  Appropriate studies are ordered and pending.  3.  Gastroesophageal reflux disease.  Continue proton pump inhibitor therapy      pending resolution of the above matters.      Madelin Rear. Sherwood Gambler, MD  Electronically Signed     LJF/MEDQ  D:  11/25/2005  T:  11/25/2005  Job:  [id number]

## 2011-04-01 NOTE — Discharge Summary (Signed)
NAMELYNCOLN, MASKELL             ACCOUNT NO.:  [id number]   MEDICAL RECORD NO.:  [id number]          PATIENT TYPE:  INP   LOCATION:  A213                          FACILITY:  [facility]   PHYSICIAN:  Madelin Rear. Sherwood Gambler, MD  DATE OF BIRTH:  01-01-1946   DATE OF ADMISSION:  11/25/2005  DATE OF DISCHARGE:  01/13/2007LH                                 DISCHARGE SUMMARY   DISCHARGE DIAGNOSES:  1.  Chest pain, question etiology, probable GI origin.  2.  Gastroesophageal reflux disease.  3.  Dyslipidemia.  4.  Status post cholecystectomy.   DISCHARGE MEDICATIONS:  1.  Ostial Bioflex p.r.n.  2.  Lipitor 5 mg daily.  3.  Prometrium 200 mg p.o. daily x12 days every three months.  4.  Boniva 150 mg x1 monthly.  5.  Vivelle Estradiol patch 0.05 mg b.i.d.  6.  Aspirin 81 mg p.o. daily.  7.  Testosterone cream daily.  8.  Vitamin E daily.   DISCHARGE MEDICATIONS:  1.  Protonix 40 mg p.o. b.i.d.  2.  Carafate suspension 2 teaspoons q.i.d.   SUMMARY:  The patient was admitted with atypical chest discomfort, localized  in her epigastric area and radiating to the substernal area.  It was rather  intense.  As noted under HPI, she was initially offered admission to [facility], but she declined that admission.  She presented to the office  with persistent ongoing pain.  She was admitted or rule out MI/unstable  angina protocol.  Her workup was completely negative with flat enzymes.  She  was seen in consultation by a Cardiology PA.  Apparently, the PA discussed  the case with a Cardiologist, Dr. Tenny Craw, and the patient had arrangements  made for a stress Cardiolite.  She was discharged asymptomatic, stable  condition on proton pump inhibitor therapy.      Madelin Rear. Sherwood Gambler, MD  Electronically Signed     LJF/MEDQ  D:  11/26/2005  T:  11/26/2005  Job:  [id number]

## 2011-04-01 NOTE — Op Note (Signed)
NAMEDOREE, KUEHNE             ACCOUNT NO.:  [id number]   MEDICAL RECORD NO.:  [id number]          PATIENT TYPE:  AMB   LOCATION:  DAY                           FACILITY:  [facility]   PHYSICIAN:  Kassie Mends, M.D.      DATE OF BIRTH:  1946-09-17   DATE OF PROCEDURE:  07/04/2006  DATE OF DISCHARGE:                                 OPERATIVE REPORT   OPERATION/PROCEDURE:  Esophagogastroduodenoscopy.   REFERRING PHYSICIAN:  Madelin Rear. Sherwood Gambler, M.D.   INDICATIONS:  Mrs. [name] is a 65 year old female with intermittent,  severe epigastric and chest pain.  Her cardiac work-up is negative.   FINDINGS:  A 2-3 cm hiatal hernia.  Otherwise normal esophagus without  evidence of Barrett's esophagus.  Normal stomach and duodenum.   RECOMMENDATIONS:  1. Obtain esophageal manometry to evaluate for esophageal motility      disorder or hypertensive LES.  2. Will perform BRAVO in two weeks if manometry is negative.  3. Outpatient visit in one month with Dr. Cira Servant.   MEDICATIONS:  1. Demerol 75 mg IV.  2. Versed 3 mg IV.   DESCRIPTION OF PROCEDURE:  A physical examination was performed and informed  consent was obtained by the patient after explaining the benefits, risks and  alternatives to the procedure.  The patient was connected to the monitor and  placed in the left lateral position.  Continuous oxygen was provided via  nasal cannula and IV medicine administered through an indwelling cannula.  After administered of sedation, the patient's esophagus was intubated  and scope was advanced under direct visualization to the second portion of  the duodenum.  The scope was withdrawn slowly by carefully examining the  texture, anatomy, and integrity of the mucosa on the way out.  The patient  was recovering in the endoscopy  suite and discharged home in satisfactory  condition.      Kassie Mends, M.D.  Electronically Signed     SM/MEDQ  D:  07/04/2006  T:  07/05/2006  Job:  [id number]   cc:    Madelin Rear. Sherwood Gambler, MD  Fax: [phone]

## 2011-07-07 ENCOUNTER — Other Ambulatory Visit: Payer: Self-pay | Admitting: Gynecology

## 2011-07-07 DIAGNOSIS — Z1231 Encounter for screening mammogram for malignant neoplasm of breast: Secondary | ICD-10-CM

## 2011-08-11 ENCOUNTER — Ambulatory Visit
Admission: RE | Admit: 2011-08-11 | Discharge: 2011-08-11 | Disposition: A | Discharge status: Home / Self Care | Payer: BC Managed Care – PPO | Source: Ambulatory Visit | Attending: Gynecology | Admitting: Gynecology

## 2011-08-11 DIAGNOSIS — Z1231 Encounter for screening mammogram for malignant neoplasm of breast: Secondary | ICD-10-CM

## 2011-08-17 ENCOUNTER — Other Ambulatory Visit: Payer: Self-pay | Admitting: Gynecology

## 2011-08-17 DIAGNOSIS — R928 Other abnormal and inconclusive findings on diagnostic imaging of breast: Secondary | ICD-10-CM

## 2011-08-30 ENCOUNTER — Other Ambulatory Visit: Payer: Self-pay | Admitting: Gynecology

## 2011-08-30 ENCOUNTER — Ambulatory Visit
Admission: RE | Admit: 2011-08-30 | Discharge: 2011-08-30 | Disposition: A | Discharge status: Home / Self Care | Payer: BC Managed Care – PPO | Source: Ambulatory Visit | Attending: Gynecology | Admitting: Gynecology

## 2011-08-30 DIAGNOSIS — R928 Other abnormal and inconclusive findings on diagnostic imaging of breast: Secondary | ICD-10-CM

## 2011-08-31 ENCOUNTER — Other Ambulatory Visit: Payer: Self-pay | Admitting: Gynecology

## 2011-08-31 ENCOUNTER — Ambulatory Visit
Admission: RE | Admit: 2011-08-31 | Discharge: 2011-08-31 | Disposition: A | Discharge status: Home / Self Care | Payer: BC Managed Care – PPO | Source: Ambulatory Visit | Attending: Gynecology | Admitting: Gynecology

## 2011-08-31 DIAGNOSIS — C50911 Malignant neoplasm of unspecified site of right female breast: Secondary | ICD-10-CM

## 2011-08-31 DIAGNOSIS — R928 Other abnormal and inconclusive findings on diagnostic imaging of breast: Secondary | ICD-10-CM

## 2011-09-05 ENCOUNTER — Ambulatory Visit
Admission: RE | Admit: 2011-09-05 | Discharge: 2011-09-05 | Disposition: A | Discharge status: Home / Self Care | Payer: BC Managed Care – PPO | Source: Ambulatory Visit | Attending: Gynecology | Admitting: Gynecology

## 2011-09-05 DIAGNOSIS — C50911 Malignant neoplasm of unspecified site of right female breast: Secondary | ICD-10-CM

## 2011-09-05 MED ORDER — GADOBENATE DIMEGLUMINE 529 MG/ML IV SOLN
14.0000 mL | Freq: Once | INTRAVENOUS | Status: AC | PRN
  Administered 2011-09-05: 14 mL via INTRAVENOUS

## 2011-09-07 ENCOUNTER — Ambulatory Visit (HOSPITAL_BASED_OUTPATIENT_CLINIC_OR_DEPARTMENT_OTHER): Payer: Self-pay | Admitting: General Surgery

## 2011-09-07 ENCOUNTER — Other Ambulatory Visit: Payer: Self-pay | Admitting: Oncology

## 2011-09-07 ENCOUNTER — Ambulatory Visit: Payer: BC Managed Care – PPO | Attending: General Surgery | Admitting: Physical Therapy

## 2011-09-07 ENCOUNTER — Encounter (HOSPITAL_BASED_OUTPATIENT_CLINIC_OR_DEPARTMENT_OTHER): Payer: BC Managed Care – PPO | Admitting: Oncology

## 2011-09-07 ENCOUNTER — Encounter (INDEPENDENT_AMBULATORY_CARE_PROVIDER_SITE_OTHER): Payer: Self-pay | Admitting: General Surgery

## 2011-09-07 VITALS — BP 127/78 | HR 83 | Temp 98.4°F | Resp 20 | Ht 64.5 in | Wt 156.4 lb

## 2011-09-07 DIAGNOSIS — M25619 Stiffness of unspecified shoulder, not elsewhere classified: Secondary | ICD-10-CM | POA: Insufficient documentation

## 2011-09-07 DIAGNOSIS — C50219 Malignant neoplasm of upper-inner quadrant of unspecified female breast: Secondary | ICD-10-CM

## 2011-09-07 DIAGNOSIS — IMO0001 Reserved for inherently not codable concepts without codable children: Secondary | ICD-10-CM | POA: Insufficient documentation

## 2011-09-07 DIAGNOSIS — E785 Hyperlipidemia, unspecified: Secondary | ICD-10-CM

## 2011-09-07 DIAGNOSIS — C50919 Malignant neoplasm of unspecified site of unspecified female breast: Secondary | ICD-10-CM | POA: Insufficient documentation

## 2011-09-07 DIAGNOSIS — C50211 Malignant neoplasm of upper-inner quadrant of right female breast: Secondary | ICD-10-CM | POA: Insufficient documentation

## 2011-09-07 DIAGNOSIS — E119 Type 2 diabetes mellitus without complications: Secondary | ICD-10-CM

## 2011-09-07 LAB — CBC WITH DIFFERENTIAL/PLATELET
Basophils Absolute: 0 10*3/uL (ref 0.0–0.1)
Eosinophils Absolute: 0.1 10*3/uL (ref 0.0–0.5)
HGB: 13.1 g/dL (ref 11.6–15.9)
MCV: 87.6 fL (ref 79.5–101.0)
MONO#: 0.3 10*3/uL (ref 0.1–0.9)
NEUT#: 2.1 10*3/uL (ref 1.5–6.5)
RBC: 4.39 10*6/uL (ref 3.70–5.45)
RDW: 13.9 % (ref 11.2–14.5)
WBC: 4 10*3/uL (ref 3.9–10.3)
lymph#: 1.5 10*3/uL (ref 0.9–3.3)

## 2011-09-07 LAB — COMPREHENSIVE METABOLIC PANEL
Albumin: 3.7 g/dL (ref 3.5–5.2)
BUN: 19 mg/dL (ref 6–23)
CO2: 29 mEq/L (ref 19–32)
Calcium: 9.3 mg/dL (ref 8.4–10.5)
Chloride: 100 mEq/L (ref 96–112)
Glucose, Bld: 159 mg/dL — ABNORMAL HIGH (ref 70–99)
Potassium: 3.5 mEq/L (ref 3.5–5.3)
Sodium: 137 mEq/L (ref 135–145)
Total Protein: 7.1 g/dL (ref 6.0–8.3)

## 2011-09-07 NOTE — Progress Notes (Signed)
Subjective:   New diagnosis right breast cancer  Patient ID: [name], female   DOB: 16-Sep-1946, 65 y.o.   MRN: [id number]  HPI Patient is a pleasant 65 year old female seen in the multidisciplinary breast clinic for new diagnosis of invasive cancer of the right breast. The patient recently presented for her routine screening mammogram. She had had no breast symptoms, specifically no lump, nipple discharge, skin changes, pain or other concerns. She has no personal history of breast disease or breast cancer. This revealed a possible mass in the right breast and subsequent diagnostic right mammogram and ultrasound were performed. Mammogram showed a small spiculated nodule at approximately the 1:00 position posteriorly in the right breast.  Ultrasound revealed a 6 mm hypoechoic nodule corresponding to this. Ultrasound a large core needle biopsy was performed to. This has revealed an invasive ductal carcinoma grade 2, ER positive, PR positive, HER-2/neu negative with a proliferation rate of 7%. Subsequent MRI bilaterally was performed which revealed a solitary mass estimated at 1.2 cm corresponding to the known malignancy. The patient is here for further treatment planning.  Past Medical History  Diagnosis Date  . Hyperlipidemia   . Diabetes mellitus    Past Surgical History  Procedure Date  . Cholecystectomy   . Oophorectomy   . Ercp     CBD stone  . Current Outpatient Prescriptions  Medication Sig Dispense Refill  . aspirin 81 MG tablet Take 81 mg by mouth daily.        . cholecalciferol (VITAMIN D) 1000 UNITS tablet Take 2,000 Units by mouth 2 (two) times daily.        Marland Kitchen ALPRAZolam (XANAX) 0.5 MG tablet       . atorvastatin (LIPITOR) 20 MG tablet       . metFORMIN (GLUCOPHAGE-XR) 500 MG 24 hr tablet       . venlafaxine (EFFEXOR) 25 MG tablet        Not on File History  Substance Use Topics  . Smoking status: Never Smoker   . Smokeless tobacco: Not on file  . Alcohol Use: No    family history includes Cancer in her sister.  Review of Systems  Constitutional: Positive for unexpected weight change.  HENT: Negative.   Respiratory: Negative.   Cardiovascular: Negative.   Gastrointestinal: Negative.   Musculoskeletal: Positive for arthralgias.  Hematological: Negative.        Objective:   Physical Exam Gen.: Pleasant well-developed Caucasian female in no distress. Skin: Warm and dry without rash or infection HEENT: No palpable masses or thyromegaly. Oropharynx clear. Sclera nonicteric. Pupils equal and reactive. Lymph nodes: No cervical, supraclavicular, or axillary nodes palpable Breasts: Slight bruise post biopsy on the right. I cannot feel any masses in either breast particular attention to the biopsy area on the right. No skin or nipple changes. Lungs: Clear without wheezing or increased work of breathing Cardiovascular: Regular rate and rhythm without murmur. No JVD or edema. Abdomen: Soft and nontender without discernible masses or organomegaly. No hernias. Extremities: No joint swelling edema or deformity Neurologic: Alert fully oriented no gross motor deficits.    Assessment:     New diagnosis of clinical stage TI CN0M0 invasive ductal carcinoma of the posterior right breast. We discussed initial surgical treatment options including lumpectomy with breast conservation and total mastectomy. She would appear to be a very good candidate for breast conservation with lumpectomy and she would strongly prefer this. We discussed the nature of the procedure, expected recovery, risks of anesthetic  complications, bleeding, infection, and possible need for further surgery based on final pathology. We discussed proceeding with sentinel lymph node biopsy for staging and this procedure was discussed in detail including slight risk of lymphedema. All her questions were answered. Due to her family history genetic testing has been recommended.    Plan:     We'll plan  needle localized right breast lumpectomy and axillary sentinel lymph node biopsy as an outpatient under general anesthesia. We wil lwant to see her genetic testing back prior to proceeding with surgery.

## 2011-09-07 NOTE — Patient Instructions (Signed)
Lumpectomy, Breast Conserving Surgery A lumpectomy is breast surgery that removes only part of the breast. Another name used may be partial mastectomy. The amount removed varies. Make sure you understand how much of your breast will be removed. Reasons for a lumpectomy:  Any solid breast mass.   Grouped significant nodularity that may be confused with a solitary breast mass.  Lumpectomy is the most common form of breast cancer surgery today. The surgeon removes the portion of your breast which contains the tumor (cancer). This is the lump. Some normal tissue around the lump is also removed to be sure that all the tumor has been removed.  If cancer cells are found in the margins where the breast tissue was removed, your surgeon will do more surgery to remove the remaining cancer tissue. This is called re-excision surgery. Radiation and/or chemotherapy treatments are often given following a lumpectomy to kill any cancer cells that could possibly remain.  REASONS YOU MAY NOT BE ABLE TO HAVE BREAST CONSERVING SURGERY:  The tumor is located in more than one place.   Your breast is small and the tumor is large so the breast would be disfigured.   The entire tumor removal is not successful with a lumpectomy.   You cannot commit to a full course of chemotherapy, radiation therapy or are pregnant and cannot have radiation.   You have previously had radiation to the breast to treat cancer.  HOW A LUMPECTOMY IS PERFORMED If overnight nursing is not required following a biopsy, a lumpectomy can be performed as a same-day surgery. This can be done in a hospital, clinic, or surgical center. The anesthesia used will depend on your surgeon. They will discuss this with you. A general anesthetic keeps you sleeping through the procedure. LET YOUR CAREGIVERS KNOW ABOUT THE FOLLOWING:  Allergies   Medications taken including herbs, eye drops, over the counter medications, and creams.   Use of steroids (by  mouth or creams)   Previous problems with anesthetics or Novocaine.   Possibility of pregnancy, if this applies   History of blood clots (thrombophlebitis)   History of bleeding or blood problems.   Previous surgery   Other health problems  BEFORE THE PROCEDURE You should be present one hour prior to your procedure unless directed otherwise.  AFTER THE PROCEDURE  After surgery, you will be taken to the recovery area where a nurse will watch and check your progress. Once you're awake, stable, and taking fluids well, barring other problems you will be allowed to go home.   Ice packs applied to your operative site may help with discomfort and keep the swelling down.   A small rubber drain may be placed in the breast for a couple of days to prevent a hematoma from developing in the breast.   A pressure dressing may be applied for 24 to 48 hours to prevent bleeding.   Keep the wound dry.   You may resume a normal diet and activities as directed. Avoid strenuous activities affecting the arm on the side of the biopsy site such as tennis, swimming, heavy lifting (more than 10 pounds) or pulling.   Bruising in the breast is normal following this procedure.   Wearing a bra - even to bed - may be more comfortable and also help keep the dressing on.   Change dressings as directed.   Only take over-the-counter or prescription medicines for pain, discomfort, or fever as directed by your caregiver.  Call for your results as   instructed by your surgeon. Remember it is your responsibility to get the results of your lumpectomy if your surgeon asked you to follow-up. Do not assume everything is fine if you have not heard from your caregiver. SEEK MEDICAL CARE IF:   There is increased bleeding (more than a small spot) from the wound.   You notice redness, swelling, or increasing pain in the wound.   Pus is coming from wound.   An unexplained oral temperature above 102 F (38.9 C) develops.     You notice a foul smell coming from the wound or dressing.  SEEK IMMEDIATE MEDICAL CARE IF:   You develop a rash.   You have difficulty breathing.   You have any allergic problems.  Document Released: 12/12/2006 Document Revised: 07/13/2011 Document Reviewed: 03/15/2007 [employer] Patient Information 2012 [employer]. 

## 2011-09-09 ENCOUNTER — Other Ambulatory Visit (INDEPENDENT_AMBULATORY_CARE_PROVIDER_SITE_OTHER): Payer: Self-pay | Admitting: General Surgery

## 2011-09-09 DIAGNOSIS — C50911 Malignant neoplasm of unspecified site of right female breast: Secondary | ICD-10-CM

## 2011-09-09 DIAGNOSIS — C50919 Malignant neoplasm of unspecified site of unspecified female breast: Secondary | ICD-10-CM

## 2011-09-19 ENCOUNTER — Other Ambulatory Visit (INDEPENDENT_AMBULATORY_CARE_PROVIDER_SITE_OTHER): Payer: Self-pay | Admitting: General Surgery

## 2011-09-20 ENCOUNTER — Other Ambulatory Visit (INDEPENDENT_AMBULATORY_CARE_PROVIDER_SITE_OTHER): Payer: Self-pay | Admitting: General Surgery

## 2011-09-25 ENCOUNTER — Encounter: Payer: Self-pay | Admitting: *Deleted

## 2011-09-25 NOTE — Progress Notes (Signed)
Mailed after appt letter to pt. 

## 2011-10-05 ENCOUNTER — Inpatient Hospital Stay: Admit: 2011-10-05 | Payer: Self-pay | Admitting: General Surgery

## 2011-10-05 ENCOUNTER — Other Ambulatory Visit (HOSPITAL_COMMUNITY): Payer: BC Managed Care – PPO

## 2011-10-05 SURGERY — BREAST LUMPECTOMY WITH SENTINEL LYMPH NODE BX
Anesthesia: General | Site: Breast | Laterality: Right

## 2011-10-05 SURGERY — Surgical Case
Anesthesia: *Unknown

## 2011-10-21 ENCOUNTER — Telehealth: Payer: Self-pay | Admitting: *Deleted

## 2011-10-21 ENCOUNTER — Encounter (HOSPITAL_BASED_OUTPATIENT_CLINIC_OR_DEPARTMENT_OTHER): Payer: Self-pay | Admitting: *Deleted

## 2011-10-21 NOTE — Telephone Encounter (Signed)
Have pt reschedule appointment about 1 week after her surgery Thx KK

## 2011-10-21 NOTE — Telephone Encounter (Signed)
Pt called states " I have a Lumpectomy on Tuesday 12/11, do I still need to see Dr. Welton Flakes on Monday?" Will review with MD.  [name] [facility]- last seen on  10/24 surgery previously scheduled for 11/21, rescheduled for 12/11.   Pt appt with you on Monday 12/10 @ 2:pm.   Please advise.

## 2011-10-21 NOTE — Telephone Encounter (Signed)
Pt notified. Tx Onc Schedule- reschedule appt

## 2011-10-21 NOTE — Progress Notes (Signed)
To come in for bmet and [employer] with husband and daughter after NL

## 2011-10-24 ENCOUNTER — Other Ambulatory Visit: Payer: Self-pay

## 2011-10-24 ENCOUNTER — Ambulatory Visit: Payer: BC Managed Care – PPO | Admitting: Oncology

## 2011-10-24 ENCOUNTER — Encounter (HOSPITAL_BASED_OUTPATIENT_CLINIC_OR_DEPARTMENT_OTHER)
Admission: RE | Admit: 2011-10-24 | Discharge: 2011-10-24 | Disposition: A | Discharge status: Home / Self Care | Payer: BC Managed Care – PPO | Source: Ambulatory Visit | Attending: General Surgery | Admitting: General Surgery

## 2011-10-24 LAB — BASIC METABOLIC PANEL
CO2: 29 mEq/L (ref 19–32)
Chloride: 102 mEq/L (ref 96–112)
Potassium: 4.7 mEq/L (ref 3.5–5.1)
Sodium: 139 mEq/L (ref 135–145)

## 2011-10-25 ENCOUNTER — Encounter (HOSPITAL_BASED_OUTPATIENT_CLINIC_OR_DEPARTMENT_OTHER)
Admission: RE | Disposition: A | Discharge status: Home / Self Care | Payer: Self-pay | Source: Ambulatory Visit | Attending: General Surgery

## 2011-10-25 ENCOUNTER — Ambulatory Visit
Admission: RE | Admit: 2011-10-25 | Discharge: 2011-10-25 | Disposition: A | Discharge status: Home / Self Care | Payer: BC Managed Care – PPO | Source: Ambulatory Visit | Attending: General Surgery | Admitting: General Surgery

## 2011-10-25 ENCOUNTER — Ambulatory Visit (HOSPITAL_BASED_OUTPATIENT_CLINIC_OR_DEPARTMENT_OTHER): Payer: BC Managed Care – PPO | Admitting: Anesthesiology

## 2011-10-25 ENCOUNTER — Other Ambulatory Visit (INDEPENDENT_AMBULATORY_CARE_PROVIDER_SITE_OTHER): Payer: Self-pay | Admitting: General Surgery

## 2011-10-25 ENCOUNTER — Other Ambulatory Visit (HOSPITAL_COMMUNITY): Payer: BC Managed Care – PPO

## 2011-10-25 ENCOUNTER — Encounter (HOSPITAL_BASED_OUTPATIENT_CLINIC_OR_DEPARTMENT_OTHER): Payer: Self-pay | Admitting: Anesthesiology

## 2011-10-25 ENCOUNTER — Ambulatory Visit (HOSPITAL_COMMUNITY)
Admission: RE | Admit: 2011-10-25 | Discharge: 2011-10-25 | Disposition: A | Discharge status: Home / Self Care | Payer: BC Managed Care – PPO | Source: Ambulatory Visit | Attending: General Surgery | Admitting: General Surgery

## 2011-10-25 ENCOUNTER — Encounter (HOSPITAL_BASED_OUTPATIENT_CLINIC_OR_DEPARTMENT_OTHER): Payer: Self-pay | Admitting: *Deleted

## 2011-10-25 ENCOUNTER — Ambulatory Visit (HOSPITAL_BASED_OUTPATIENT_CLINIC_OR_DEPARTMENT_OTHER)
Admission: RE | Admit: 2011-10-25 | Discharge: 2011-10-25 | Disposition: A | Discharge status: Home / Self Care | Payer: BC Managed Care – PPO | Source: Ambulatory Visit | Attending: General Surgery | Admitting: General Surgery

## 2011-10-25 DIAGNOSIS — C50919 Malignant neoplasm of unspecified site of unspecified female breast: Secondary | ICD-10-CM

## 2011-10-25 DIAGNOSIS — E119 Type 2 diabetes mellitus without complications: Secondary | ICD-10-CM | POA: Insufficient documentation

## 2011-10-25 DIAGNOSIS — Z170 Estrogen receptor positive status [ER+]: Secondary | ICD-10-CM | POA: Insufficient documentation

## 2011-10-25 DIAGNOSIS — Z01812 Encounter for preprocedural laboratory examination: Secondary | ICD-10-CM | POA: Insufficient documentation

## 2011-10-25 DIAGNOSIS — C50911 Malignant neoplasm of unspecified site of right female breast: Secondary | ICD-10-CM

## 2011-10-25 DIAGNOSIS — C50219 Malignant neoplasm of upper-inner quadrant of unspecified female breast: Secondary | ICD-10-CM

## 2011-10-25 HISTORY — PX: BREAST LUMPECTOMY: SHX2

## 2011-10-25 LAB — POCT HEMOGLOBIN-HEMACUE: Hemoglobin: 12.6 g/dL (ref 12.0–15.0)

## 2011-10-25 LAB — GLUCOSE, CAPILLARY
Glucose-Capillary: 96 mg/dL (ref 70–99)
Glucose-Capillary: 96 mg/dL (ref 70–99)

## 2011-10-25 SURGERY — BREAST LUMPECTOMY WITH SENTINEL LYMPH NODE BX
Anesthesia: General | Site: Breast | Laterality: Right | Wound class: Clean

## 2011-10-25 MED ORDER — METOCLOPRAMIDE HCL 5 MG/ML IJ SOLN
INTRAMUSCULAR | Status: DC | PRN
  Administered 2011-10-25 (×2): 10 mg via INTRAVENOUS

## 2011-10-25 MED ORDER — ONDANSETRON HCL 4 MG/2ML IJ SOLN
INTRAMUSCULAR | Status: DC | PRN
  Administered 2011-10-25 (×2): 4 mg via INTRAVENOUS

## 2011-10-25 MED ORDER — SODIUM CHLORIDE 0.9 % IJ SOLN
INTRAMUSCULAR | Status: DC | PRN
  Administered 2011-10-25: 12:00:00 via INTRAMUSCULAR

## 2011-10-25 MED ORDER — FENTANYL CITRATE 0.05 MG/ML IJ SOLN
INTRAMUSCULAR | Status: DC | PRN
  Administered 2011-10-25: 50 ug via INTRAVENOUS

## 2011-10-25 MED ORDER — BUPIVACAINE-EPINEPHRINE 0.25% -1:200000 IJ SOLN
INTRAMUSCULAR | Status: DC | PRN
  Administered 2011-10-25: 30 mL

## 2011-10-25 MED ORDER — TECHNETIUM TC 99M SULFUR COLLOID FILTERED
1.0000 | Freq: Once | INTRAVENOUS | Status: AC | PRN
  Administered 2011-10-25: 1 via INTRADERMAL

## 2011-10-25 MED ORDER — FENTANYL CITRATE 0.05 MG/ML IJ SOLN
25.0000 ug | INTRAMUSCULAR | Status: DC | PRN
  Administered 2011-10-25 (×2): 25 ug via INTRAVENOUS

## 2011-10-25 MED ORDER — MORPHINE SULFATE 2 MG/ML IJ SOLN: 0.0500 mg/kg | INTRAMUSCULAR | Status: DC | PRN

## 2011-10-25 MED ORDER — LACTATED RINGERS IV SOLN: INTRAVENOUS | Status: DC

## 2011-10-25 MED ORDER — CEFAZOLIN SODIUM 1-5 GM-% IV SOLN
1.0000 g | INTRAVENOUS | Status: AC
  Administered 2011-10-25: 1 g via INTRAVENOUS

## 2011-10-25 MED ORDER — PROPOFOL 10 MG/ML IV EMUL
INTRAVENOUS | Status: DC | PRN
  Administered 2011-10-25: 200 mg via INTRAVENOUS

## 2011-10-25 MED ORDER — METOCLOPRAMIDE HCL 5 MG/ML IJ SOLN: 10.0000 mg | Freq: Once | INTRAMUSCULAR | Status: DC | PRN

## 2011-10-25 MED ORDER — LIDOCAINE HCL (CARDIAC) 20 MG/ML IV SOLN
INTRAVENOUS | Status: DC | PRN
  Administered 2011-10-25: 50 mg via INTRAVENOUS

## 2011-10-25 MED ORDER — HYDROCODONE-ACETAMINOPHEN 5-325 MG PO TABS: 1.0000 | ORAL_TABLET | ORAL | Status: AC | PRN

## 2011-10-25 MED ORDER — ACETAMINOPHEN 10 MG/ML IV SOLN
1000.0000 mg | Freq: Once | INTRAVENOUS | Status: AC
  Administered 2011-10-25: 1000 mg via INTRAVENOUS

## 2011-10-25 MED ORDER — LACTATED RINGERS IV SOLN
INTRAVENOUS | Status: DC
Start: 2011-10-25 — End: 2011-10-25
  Administered 2011-10-25 (×2): via INTRAVENOUS

## 2011-10-25 MED ORDER — FENTANYL CITRATE 0.05 MG/ML IJ SOLN
50.0000 ug | INTRAMUSCULAR | Status: DC | PRN
  Administered 2011-10-25: 25 ug via INTRAVENOUS
  Administered 2011-10-25: 50 ug via INTRAVENOUS

## 2011-10-25 MED ORDER — OXYCODONE HCL 5 MG PO TABS
5.0000 mg | ORAL_TABLET | Freq: Once | ORAL | Status: AC
  Administered 2011-10-25: 5 mg via ORAL

## 2011-10-25 MED ORDER — MIDAZOLAM HCL 2 MG/2ML IJ SOLN
0.5000 mg | INTRAMUSCULAR | Status: DC | PRN
  Administered 2011-10-25: 0.5 mg via INTRAVENOUS
  Administered 2011-10-25: 1 mg via INTRAVENOUS

## 2011-10-25 SURGICAL SUPPLY — 62 items
APPLIER CLIP 11 MED OPEN (CLIP)
APR CLP MED 11 20 MLT OPN (CLIP)
BINDER BREAST XLRG (GAUZE/BANDAGES/DRESSINGS) ×2 IMPLANT
BLADE SURG 10 STRL SS (BLADE) ×2 IMPLANT
BLADE SURG 15 STRL LF DISP TIS (BLADE) ×1 IMPLANT
BLADE SURG 15 STRL SS (BLADE) ×2
CANISTER SUCTION 1200CC (MISCELLANEOUS) ×2 IMPLANT
CHLORAPREP W/TINT 26ML (MISCELLANEOUS) ×2 IMPLANT
CLIP APPLIE 11 MED OPEN (CLIP) IMPLANT
CLIP TI WIDE RED SMALL 6 (CLIP) ×4 IMPLANT
CLOTH BEACON ORANGE TIMEOUT ST (SAFETY) ×2 IMPLANT
COVER MAYO STAND STRL (DRAPES) ×2 IMPLANT
COVER PROBE W GEL 5X96 (DRAPES) ×2 IMPLANT
COVER TABLE BACK 60X90 (DRAPES) ×2 IMPLANT
DECANTER SPIKE VIAL GLASS SM (MISCELLANEOUS) IMPLANT
DERMABOND ADVANCED (GAUZE/BANDAGES/DRESSINGS) ×2
DERMABOND ADVANCED .7 DNX12 (GAUZE/BANDAGES/DRESSINGS) ×2 IMPLANT
DEVICE DUBIN W/COMP PLATE 8390 (MISCELLANEOUS) ×2 IMPLANT
DRAIN CHANNEL 19F RND (DRAIN) IMPLANT
DRAIN HEMOVAC 1/8 X 5 (WOUND CARE) IMPLANT
DRAPE LAPAROSCOPIC ABDOMINAL (DRAPES) ×2 IMPLANT
DRAPE UTILITY XL STRL (DRAPES) ×2 IMPLANT
ELECT COATED BLADE 2.86 ST (ELECTRODE) ×2 IMPLANT
ELECT REM PT RETURN 9FT ADLT (ELECTROSURGICAL) ×2
ELECTRODE REM PT RTRN 9FT ADLT (ELECTROSURGICAL) ×1 IMPLANT
EVACUATOR SILICONE 100CC (DRAIN) IMPLANT
GLOVE BIOGEL PI IND STRL 7.0 (GLOVE) ×1 IMPLANT
GLOVE BIOGEL PI IND STRL 8 (GLOVE) ×1 IMPLANT
GLOVE BIOGEL PI INDICATOR 7.0 (GLOVE) ×1
GLOVE BIOGEL PI INDICATOR 8 (GLOVE) ×1
GLOVE SKINSENSE NS SZ6.5 (GLOVE) ×1
GLOVE SKINSENSE STRL SZ6.5 (GLOVE) ×1 IMPLANT
GLOVE SS BIOGEL STRL SZ 7.5 (GLOVE) ×1 IMPLANT
GLOVE SUPERSENSE BIOGEL SZ 7.5 (GLOVE) ×1
GOWN PREVENTION PLUS XLARGE (GOWN DISPOSABLE) ×2 IMPLANT
GOWN PREVENTION PLUS XXLARGE (GOWN DISPOSABLE) ×2 IMPLANT
KIT MARKER MARGIN INK (KITS) ×2 IMPLANT
NDL SAFETY ECLIPSE 18X1.5 (NEEDLE) ×1 IMPLANT
NEEDLE HYPO 18GX1.5 SHARP (NEEDLE) ×1
NEEDLE HYPO 25X1 1.5 SAFETY (NEEDLE) ×4 IMPLANT
NS IRRIG 1000ML POUR BTL (IV SOLUTION) ×2 IMPLANT
PACK BASIN DAY SURGERY FS (CUSTOM PROCEDURE TRAY) ×2 IMPLANT
PAD ALCOHOL SWAB (MISCELLANEOUS) ×2 IMPLANT
PENCIL BUTTON HOLSTER BLD 10FT (ELECTRODE) ×2 IMPLANT
PIN SAFETY STERILE (MISCELLANEOUS) IMPLANT
SLEEVE SCD COMPRESS KNEE MED (MISCELLANEOUS) ×2 IMPLANT
SPONGE LAP 18X18 X RAY DECT (DISPOSABLE) IMPLANT
SPONGE LAP 4X18 X RAY DECT (DISPOSABLE) ×2 IMPLANT
STAPLER VISISTAT 35W (STAPLE) IMPLANT
SUT ETHILON 3 0 FSL (SUTURE) IMPLANT
SUT MON AB 4-0 PC3 18 (SUTURE) IMPLANT
SUT MON AB 5-0 PS2 18 (SUTURE) ×2 IMPLANT
SUT SILK 3 0 SH 30 (SUTURE) IMPLANT
SUT VIC AB 3-0 SH 27 (SUTURE)
SUT VIC AB 3-0 SH 27X BRD (SUTURE) IMPLANT
SUT VICRYL 3-0 CR8 SH (SUTURE) ×2 IMPLANT
SYR CONTROL 10ML LL (SYRINGE) ×4 IMPLANT
TOWEL OR 17X24 6PK STRL BLUE (TOWEL DISPOSABLE) ×4 IMPLANT
TOWEL OR NON WOVEN STRL DISP B (DISPOSABLE) ×2 IMPLANT
TUBE CONNECTING 20X1/4 (TUBING) ×2 IMPLANT
WATER STERILE IRR 1000ML POUR (IV SOLUTION) IMPLANT
YANKAUER SUCT BULB TIP NO VENT (SUCTIONS) ×2 IMPLANT

## 2011-10-25 NOTE — Anesthesia Postprocedure Evaluation (Signed)
  Anesthesia Post Note  Patient: [name]  Procedure(s) Performed:  BREAST LUMPECTOMY WITH SENTINEL LYMPH NODE BX - needle localization Breast Center 11:15 Sential lymph node Sheralyn Boatman 1:15  Anesthesia type: General  Patient location: PACU  Post pain: Pain level controlled  Post assessment: Patient's Cardiovascular Status Stable  Last Vitals:  Filed Vitals:   10/25/11 1459  BP:   Pulse: 90  Temp:   Resp: 14    Post vital signs: Reviewed and stable  Level of consciousness: alert  Complications: No apparent anesthesia complications

## 2011-10-25 NOTE — Op Note (Signed)
Surgeon: Glenna Fellows T   Assistants: None  Anesthesia: General LMA anesthesia  Indications: T1, N0, M0 invasive carcinoma of the right breast    Procedure Detail: following successful needle localization the patient is brought to the operating room placed in the supine position the operating table laryngeal mask general anesthesia was induced. Patient timeout and correct procedure were verified. Under sterile technique I injected 10 cc of dilute methylene blue subcutaneously beneath the right nipple and massaged this for 1 minute. The entire right breast chest axilla and upper arm were then widely sterilely prepped and draped. The lumpectomy was approached initially. I made a curvilinear incision in the upper inner breast over the ultrasound mark the site of the tumor and dissection was carried into the subcutaneous tissue. The dissection was deepened down to the breast capsule and the localizing wire was brought into the incision. I then excised a generous specimen of tissue around the shaft and the tip of the wire taking this down to the chest wall and excising the pectoral fascia as the posterior margin as this was a very posterior lesion. The specimen was inked for margins. Specimen mammography showed the clip and localizing wire well within the specimen. This was sent for permanent pathology. Attention was then turned to the sentinel lymph node. A hot area in the right axilla was localized and a small transverse incision made. Dissection was then carried down to subcutaneous tissue using cautery. The clavipectoral fascia was incised with careful blunt and cautery dissection of the axilla proper entered. A localized a hot area with the neoprobe and there was a blue lymphatic seen. I excised this area with careful cautery dissection and ex vivo counts were approximately 250 that there was no blue nodes seen. Counts in the axilla still seemed to be about 300. I excised another small palpable  node that was hot adjacent to this and ex vivo it had counts greater than 300 but no blue dye. It was marked and sent as a second hot sentinel lymph node. At this point behind areas I had excised was a area much hotter and I was able to expose a blue lymph node. This node was excised with cautery completely and ex vivo had counts of almost 3000 and was blue. This was sent as hot blue axillary sentinel lymph node. Background in the axilla at this point was less than 20. The soft tissue both incisions was irrigated and complete hemostasis obtained. Soft tissue was infiltrated with Marcaine with epinephrine. The breast incision I mobilized some breast tissue off the anterior chest wall superiorly and inferiorly to allow closure of the posterior breast capsule and breast tissue with interrupted Vicryl's. The cavity had been marked with clips. The subcutaneous tissue was closed with Vicryl. The deeper axillary tissue was closed with interrupted Vicryl. Both incisions were closed with running subcuticular 4-0 Monocryl and Dermabond. Sponge needle and instrument counts were correct. The patient was taken to recovery in good condition    Estimated Blood Loss:  Minimal         Drains: None         Blood Given: none          Specimens: #1 left breast tissue #2 hot axillary sentinel lymph node #3 hot axillary sentinel lymph node #4 hot and blue axillary sentinel lymph node        Complications:  * No complications entered in OR log *         Disposition: PACU -  hemodynamically stable.         Condition: stable  Mariella Saa MD, FACS  10/25/2011, 1:44 PM

## 2011-10-25 NOTE — H&P (Signed)
  Subjective:    Patient is a pleasant 65 year old female seen in the multidisciplinary breast clinic for new diagnosis of invasive cancer of the right breast. The patient recently presented for her routine screening mammogram. She had had no breast symptoms, specifically no lump, nipple discharge, skin changes, pain or other concerns. She has no personal history of breast disease or breast cancer. This revealed a possible mass in the right breast and subsequent diagnostic right mammogram and ultrasound were performed. Mammogram showed a small spiculated nodule at approximately the 1:00 position posteriorly in the right breast. Ultrasound revealed a 6 mm hypoechoic nodule corresponding to this. Ultrasound a large core needle biopsy was performed to. This has revealed an invasive ductal carcinoma grade 2, ER positive, PR positive, HER-2/neu negative with a proliferation rate of 7%. Subsequent MRI bilaterally was performed which revealed a solitary mass estimated at 1.2 cm corresponding to the known malignancy.  She presents for lumpectomy and SLN Bx  Past Surgical History  Procedure Date  . Cholecystectomy   . Oophorectomy   . Ercp     CBD stone   Past Medical History  Diagnosis Date  . Hyperlipidemia   . Diabetes mellitus    Current Facility-Administered Medications  Medication Dose Route Frequency Provider Last Rate Last Dose  . acetaminophen (OFIRMEV) IVPB 1,000 mg  1,000 mg Intravenous Once Constance Goltz, MD   1,000 mg at 10/25/11 1017  . ceFAZolin (ANCEF) IVPB 1 g/50 mL premix  1 g Intravenous 60 min Pre-Op Mariella Saa, MD      . fentaNYL (SUBLIMAZE) injection 50-100 mcg  50-100 mcg Intravenous PRN Constance Goltz, MD   25 mcg at 10/25/11 1043  . lactated ringers infusion   Intravenous Continuous Constance Goltz, MD 10 mL/hr at 10/25/11 1012    . midazolam (VERSED) injection 0.5-2 mg  0.5-2 mg Intravenous PRN Constance Goltz, MD   0.5 mg at  10/25/11 1044   Facility-Administered Medications Ordered in Other Encounters  Medication Dose Route Frequency Provider Last Rate Last Dose  . technetium sulfur colloid ([place]COMED-[place]) filtered injection solution 1 milli Curie  1 milli Curie Intradermal Once PRN Medication Radiologist   1 milli Curie at 10/25/11 1044   Allergies  Allergen Reactions  . Codeine Nausea And Vomiting       Objective:   BP 128/68  Pulse 73  Temp(Src) 98 F (36.7 C) (Oral)  Resp 24  Ht 5\' 3"  (1.6 m)  Wt 153 lb (69.4 kg)  BMI 27.10 kg/m2  SpO2 100%  General:  alert, cooperative and appears stated age Skin:  normal Breasts: No palpable masses Mouth: MMM no lesions Lymph Nodes:  Cervical, supraclavicular, and axillary nodes normal. Lungs:  clear to auscultation bilaterally Heart:  regular rate and rhythm, S1, S2 normal, no murmur, click, rub or gallop Abdomen: soft, non-tender; bowel sounds normal; no masses,  no organomegaly   Extremities:  extremities normal, atraumatic, no cyanosis or edema Neurologic:  negative Psychiatric:  normal mood, behavior, speech, dress, and thought processes    Assessment:CA right breast    Plan:Right lumpectomy, SLN Bx   1. Discussed the risk of surgery,  and the risks of general anesthetic including MI, CVA, sudden death or even reaction to anesthetic medications. The patient understands the risks, any and all questions were answered to the patient's satisfaction.  Mariella Saa MD, FACS  10/25/2011, 11:56 AM

## 2011-10-25 NOTE — Transfer of Care (Signed)
Immediate Anesthesia Transfer of Care Note  Patient: [name]  Procedure(s) Performed:  BREAST LUMPECTOMY WITH SENTINEL LYMPH NODE BX - needle localization Breast Center 11:15 Sential lymph node Sheralyn Boatman 1:15  Patient Location: PACU  Anesthesia Type: General  Level of Consciousness: awake  Airway & Oxygen Therapy: Patient Spontanous Breathing and Patient connected to face mask oxygen  Post-op Assessment: Report given to PACU RN and Post -op Vital signs reviewed and stable  Post vital signs: Reviewed and stable  Complications: No apparent anesthesia complications

## 2011-10-25 NOTE — Anesthesia Preprocedure Evaluation (Signed)
Anesthesia Evaluation  Patient identified by MRN, date of birth, ID band Patient awake    Reviewed: Allergy & Precautions, H&P , NPO status , Patient's Chart, lab work & pertinent test results, reviewed documented beta blocker date and time   Airway Mallampati: II TM Distance: >3 FB Neck ROM: full    Dental   Pulmonary neg pulmonary ROS,          Cardiovascular neg cardio ROS     Neuro/Psych Negative Neurological ROS  Negative Psych ROS   GI/Hepatic negative GI ROS, Neg liver ROS,   Endo/Other  Diabetes mellitus-, Well Controlled, Type 2, Oral Hypoglycemic Agents  Renal/GU negative Renal ROS  Genitourinary negative   Musculoskeletal   Abdominal   Peds  Hematology negative hematology ROS (+)   Anesthesia Other Findings See surgeon's H&P   Reproductive/Obstetrics negative OB ROS                           Anesthesia Physical Anesthesia Plan  ASA: II  Anesthesia Plan: General   Post-op Pain Management:    Induction: Intravenous  Airway Management Planned: LMA  Additional Equipment:   Intra-op Plan:   Post-operative Plan: Extubation in OR  Informed Consent: I have reviewed the patients History and Physical, chart, labs and discussed the procedure including the risks, benefits and alternatives for the proposed anesthesia with the patient or authorized representative who has indicated his/her understanding and acceptance.     Plan Discussed with: CRNA and Surgeon  Anesthesia Plan Comments:         Anesthesia Quick Evaluation

## 2011-10-25 NOTE — Anesthesia Procedure Notes (Signed)
Procedure Name: LMA Insertion Performed by: Taige Housman Pre-anesthesia Checklist: Patient identified, Emergency Drugs available, Suction available, Patient being monitored and Timeout performed Patient Re-evaluated:Patient Re-evaluated prior to inductionOxygen Delivery Method: Circle System Utilized Preoxygenation: Pre-oxygenation with 100% oxygen Intubation Type: IV induction Ventilation: Mask ventilation without difficulty LMA: LMA with gastric port inserted LMA Size: 4.0 Number of attempts: 1 Placement Confirmation: breath sounds checked- equal and bilateral and positive ETCO2 Tube secured with: Tape Dental Injury: Teeth and Oropharynx as per pre-operative assessment      

## 2011-10-27 ENCOUNTER — Other Ambulatory Visit: Payer: Self-pay | Admitting: Oncology

## 2011-10-27 NOTE — Telephone Encounter (Signed)
Patient to see me on 12/18 at 11:00

## 2011-10-28 ENCOUNTER — Telehealth: Payer: Self-pay | Admitting: *Deleted

## 2011-10-28 ENCOUNTER — Encounter: Payer: Self-pay | Admitting: *Deleted

## 2011-10-28 NOTE — Telephone Encounter (Signed)
left voice message to inform the patient new date and time 11-01-2011 starting at 10:30am

## 2011-10-28 NOTE — Telephone Encounter (Signed)
left voice message to inform the patient new date and time 11-01-2011 starting at 10:30am 

## 2011-10-31 ENCOUNTER — Telehealth (INDEPENDENT_AMBULATORY_CARE_PROVIDER_SITE_OTHER): Payer: Self-pay | Admitting: General Surgery

## 2011-10-31 ENCOUNTER — Telehealth (INDEPENDENT_AMBULATORY_CARE_PROVIDER_SITE_OTHER): Payer: Self-pay

## 2011-10-31 NOTE — Telephone Encounter (Signed)
Pathology results given to patient on 10/31/11, PO appt given also for 11/18/11 w/Dr. Johna Sheriff.  Patient has appt with Dr. Welton Flakes on 11/01/11.

## 2011-10-31 NOTE — Telephone Encounter (Signed)
Called pt and discussed path 

## 2011-11-01 ENCOUNTER — Ambulatory Visit (HOSPITAL_BASED_OUTPATIENT_CLINIC_OR_DEPARTMENT_OTHER): Payer: BC Managed Care – PPO | Admitting: Oncology

## 2011-11-01 ENCOUNTER — Other Ambulatory Visit: Payer: BC Managed Care – PPO | Admitting: Lab

## 2011-11-01 ENCOUNTER — Telehealth: Payer: Self-pay | Admitting: *Deleted

## 2011-11-01 VITALS — BP 115/74 | HR 71 | Temp 97.8°F | Ht 63.0 in | Wt 157.5 lb

## 2011-11-01 DIAGNOSIS — Z170 Estrogen receptor positive status [ER+]: Secondary | ICD-10-CM

## 2011-11-01 DIAGNOSIS — C50919 Malignant neoplasm of unspecified site of unspecified female breast: Secondary | ICD-10-CM

## 2011-11-01 NOTE — Telephone Encounter (Signed)
gave patient appointment for 12-2011 printed out calendar and gave to the patient 

## 2011-11-01 NOTE — Progress Notes (Signed)
OFFICE PROGRESS NOTE  CC  Dr. Glenna Fellows Dr. Elfredia Nevins Dr. Beather Arbour  DIAGNOSIS:  65 year old female with stage I (T1 N0) invasive mammary carcinoma of the right breast diagnosed originally 08/30/2011. Patient is now status post right breast lumpectomy with sentinel node biopsy.   PRIOR THERAPY:  #1 patient is status post lumpectomy performed on 10/25/2011. The final pathology revealed a 1.2 cm invasive ductal carcinoma without lymphovascular invasion.. There was noted to be perineural involvement 3 sentinel nodes were negative for metastatic disease. Tumor was strongly estrogen receptor +100% progesterone receptor +100% HER-2/neu negative proliferation marker 7% pathologic staging T1 C. N0  CURRENT THERAPY:patient will now proceed with radiation therapy given by Dr. Doristine Devoid  INTERVAL HISTORY: [name] 65 y.o. female returns forfollowup visit today overall she is doing well she tolerated the surgery quite well she is very pleased with her final pathology. She is without any complaints no nausea vomiting fevers chills night sweats no breast tenderness. Remainder of the 10 point review of systems is negative.  MEDICAL HISTORY: Past Medical History  Diagnosis Date  . Hyperlipidemia   . Diabetes mellitus     ALLERGIES:  is allergic to codeine.  MEDICATIONS:  Current Outpatient Prescriptions  Medication Sig Dispense Refill  . ALPRAZolam (XANAX) 0.5 MG tablet       . aspirin 81 MG tablet Take 81 mg by mouth daily.        Marland Kitchen atorvastatin (LIPITOR) 20 MG tablet       . cholecalciferol (VITAMIN D) 1000 UNITS tablet Take 2,000 Units by mouth 2 (two) times daily.        Marland Kitchen HYDROcodone-acetaminophen (NORCO) 5-325 MG per tablet Take 1-2 tablets by mouth every 4 (four) hours as needed for pain.  30 tablet  1  . metFORMIN (GLUCOPHAGE-XR) 500 MG 24 hr tablet       . Omega-3 Fatty Acids (FISH OIL) 1000 MG CAPS Take 1 each by mouth.        . venlafaxine (EFFEXOR) 25  MG tablet 25 mg 2 (two) times daily.         SURGICAL HISTORY:  Past Surgical History  Procedure Date  . Cholecystectomy   . Oophorectomy   . Ercp     CBD stone    REVIEW OF SYSTEMS:  Pertinent items are noted in HPI.   PHYSICAL EXAMINATION: General appearance: alert, cooperative and appears stated age Head: Normocephalic, without obvious abnormality, atraumatic Neck: no adenopathy, no carotid bruit, no JVD, supple, symmetrical, trachea midline and thyroid not enlarged, symmetric, no tenderness/mass/nodules Lymph nodes: Cervical, supraclavicular, and axillary nodes normal. Resp: clear to auscultation bilaterally and normal percussion bilaterally Back: symmetric, no curvature. ROM normal. No CVA tenderness. Cardio: regular rate and rhythm, S1, S2 normal, no murmur, click, rub or gallop GI: soft, non-tender; bowel sounds normal; no masses,  no organomegaly Extremities: extremities normal, atraumatic, no cyanosis or edema Neurologic: Alert and oriented X 3, normal strength and tone. Normal symmetric reflexes. Normal coordination and gait Bilateral breasts are examined left breast no masses nipple discharge right breast reveals healing surgical scar there is a little bit of nipple retraction otherwise no other findings. ECOG PERFORMANCE STATUS: 0 - Asymptomatic  Blood pressure 115/74, pulse 71, temperature 97.8 F (36.6 C), temperature source Oral, height 5\' 3"  (1.6 m), weight 157 lb 8 oz (71.442 kg).  LABORATORY DATA: Lab Results  Component Value Date   WBC 4.0 09/07/2011   HGB 12.6 10/25/2011   HCT 38.5  09/07/2011   MCV 87.6 09/07/2011   PLT 196 09/07/2011      Chemistry      Component Value Date/Time   NA 139 10/24/2011 1300   K 4.7 10/24/2011 1300   CL 102 10/24/2011 1300   CO2 29 10/24/2011 1300   BUN 17 10/24/2011 1300   CREATININE 0.71 10/24/2011 1300      Component Value Date/Time   CALCIUM 9.6 10/24/2011 1300   ALKPHOS 73 09/07/2011 1158   AST 17 09/07/2011  1158   ALT 15 09/07/2011 1158   BILITOT 0.5 09/07/2011 1158       RADIOGRAPHIC STUDIES:  Nm Sentinel Node Inj-no Rpt (breast)  10/25/2011  CLINICAL DATA: RT BREAST INJECTION   Sulfur colloid was injected intradermally by the nuclear medicine  technologist for breast cancer sentinel node localization.     [place] Wire Localization Right  10/25/2011  *RADIOLOGY REPORT*  Clinical Data:  Known right breast carcinoma.  Preoperative localization.  NEEDLE LOCALIZATION USING ULTRASOUND GUIDANCE AND SPECIMEN RADIOGRAPH  Patient presents for needle localization prior to surgical excision of right breast mass with clip in the.  I met with the patient and we discussed the procedure of needle localization including risks, benefits, and alternatives.  Specifically, we discussed the risks of infection, bleeding, tissue injury, and inadequate sampling. Informed written consent was given.  Using ultrasound guidance, sterile technique, 2% lidocaine and a 7 cm Ultrawire, the superiorly located right breast mass was localized using a medial approach.  The films are marked for Dr. Johna Sheriff.  Specimen radiograph is performed at day surgery, and confirms the mass with clip and intact wire to be present in the tissue sample. The specimen is marked for pathology.  IMPRESSION: Needle localization of the right breast.  No apparent complications.  Original Report Authenticated By: Rolla Plate, M.D.   Mm Breast Surgical Specimen  10/25/2011  *RADIOLOGY REPORT*  Clinical Data:  Known right breast carcinoma.  Preoperative localization.  NEEDLE LOCALIZATION USING ULTRASOUND GUIDANCE AND SPECIMEN RADIOGRAPH  Patient presents for needle localization prior to surgical excision of right breast mass with clip in the.  I met with the patient and we discussed the procedure of needle localization including risks, benefits, and alternatives.  Specifically, we discussed the risks of infection, bleeding, tissue injury, and inadequate  sampling. Informed written consent was given.  Using ultrasound guidance, sterile technique, 2% lidocaine and a 7 cm Ultrawire, the superiorly located right breast mass was localized using a medial approach.  The films are marked for Dr. Johna Sheriff.  Specimen radiograph is performed at day surgery, and confirms the mass with clip and intact wire to be present in the tissue sample. The specimen is marked for pathology.  IMPRESSION: Needle localization of the right breast.  No apparent complications.  Original Report Authenticated By: Rolla Plate, M.D.    ASSESSMENT: 65 year old female with new diagnosis of stage I invasive ductal carcinoma of the right breast status post lumpectomy with sentinel node biopsy. The final pathology revealing a T1 C. N0 rest cancer. ER positive PR positive HER-2/neu negative with a proliferation marker of 7%. Post lumpectomy patient is doing extremely well.   PLAN: I will refer her to Dr. Doristine Devoid for consideration of radiation therapy postlumpectomy. Once she completes that then we will discuss antiestrogen therapy. This will most likely consist of an aromatase inhibitor such as Arimidex 1 mg daily for a duration of 5 years.   All questions were answered. The patient knows to call the  clinic with any problems, questions or concerns. We can certainly see the patient much sooner if necessary.  I spent 20 minutes counseling the patient face to face. The total time spent in the appointment was 30 minutes.    Drue Second, MD Medical/Oncology [facility] [phone] (beeper) [phone] (Office)  11/01/2011, 12:14 PM

## 2011-11-11 ENCOUNTER — Encounter: Payer: Self-pay | Admitting: *Deleted

## 2011-11-16 ENCOUNTER — Ambulatory Visit
Admission: RE | Admit: 2011-11-16 | Discharge: 2011-11-16 | Disposition: A | Discharge status: Home / Self Care | Payer: BC Managed Care – PPO | Source: Ambulatory Visit | Attending: Radiation Oncology | Admitting: Radiation Oncology

## 2011-11-16 ENCOUNTER — Encounter: Payer: Self-pay | Admitting: Radiation Oncology

## 2011-11-16 VITALS — BP 115/70 | HR 90 | Temp 98.3°F | Wt 154.4 lb

## 2011-11-16 DIAGNOSIS — C50219 Malignant neoplasm of upper-inner quadrant of unspecified female breast: Secondary | ICD-10-CM

## 2011-11-16 DIAGNOSIS — Y842 Radiological procedure and radiotherapy as the cause of abnormal reaction of the patient, or of later complication, without mention of misadventure at the time of the procedure: Secondary | ICD-10-CM | POA: Insufficient documentation

## 2011-11-16 DIAGNOSIS — E119 Type 2 diabetes mellitus without complications: Secondary | ICD-10-CM | POA: Insufficient documentation

## 2011-11-16 DIAGNOSIS — Z170 Estrogen receptor positive status [ER+]: Secondary | ICD-10-CM | POA: Insufficient documentation

## 2011-11-16 DIAGNOSIS — Z510 Encounter for antineoplastic radiation therapy: Secondary | ICD-10-CM | POA: Insufficient documentation

## 2011-11-16 DIAGNOSIS — L589 Radiodermatitis, unspecified: Secondary | ICD-10-CM | POA: Insufficient documentation

## 2011-11-16 DIAGNOSIS — E785 Hyperlipidemia, unspecified: Secondary | ICD-10-CM | POA: Insufficient documentation

## 2011-11-16 DIAGNOSIS — Z79899 Other long term (current) drug therapy: Secondary | ICD-10-CM | POA: Insufficient documentation

## 2011-11-16 DIAGNOSIS — Z803 Family history of malignant neoplasm of breast: Secondary | ICD-10-CM | POA: Insufficient documentation

## 2011-11-16 NOTE — Progress Notes (Addendum)
Codeine intolerance   Accompanied by Spouse  Ready to "get this show on the road" because she has just bought a new house and is anxious to start work on the house

## 2011-11-16 NOTE — Progress Notes (Signed)
Please see the Nurse Progress Note in the MD Initial Consult Encounter for this patient. 

## 2011-11-16 NOTE — Progress Notes (Signed)
Encounter addended by: Delynn Flavin, RN on: 11/16/2011  5:39 PM<BR>     Documentation filed: Charges VN

## 2011-11-16 NOTE — Progress Notes (Signed)
[facility] Radiation Oncology Followup new consult EVALUATION  Name: [name] MRN: [id number]  Date: 11/16/2011  DOB: 08-19-1946  Status: outpatient   CC:   Vanessa December., MD    REFERRING PHYSICIAN: Victorino December., MD   DIAGNOSIS: The encounter diagnosis was Cancer of upper-inner quadrant of female breast. right breast invasive ductal carcinoma; pathologic stage TI C. N0 M0, ER/PR positive HER-2/neu negative, grade 1    HISTORY OF PRESENT ILLNESS:  [name] is a 66 y.o. female who is here in clinic today for followup after surgery. I previously met her in October 2012 when she was diagnosed with clinical stage I breast cancer of the right upper inner quadrant. She went on have a lumpectomy on 10/25/2011. I reviewed the path report which demonstrates that she had a 1.2 cm lesion with no lymphovascular invasion. The margins are negative by 0.2cm. There was perineural involvement. All 3 sentinel nodes were negative. The tumor was grade 1 invasive ductal carcinoma. As described earlier, her disease is ER and PR positive and HER-2/neu negative; pathologic stage is T1 CN 0.  She has recovered well from surgery. She has plans to start aromatase inhibitor therapy after radiotherapy.   PREVIOUS RADIATION THERAPY: No   PAST MEDICAL HISTORY:  has a past medical history of Hyperlipidemia; Diabetes mellitus; and Breast cancer.     PAST SURGICAL HISTORY:  Past Surgical History  Procedure Date  . Cholecystectomy   . Oophorectomy     with a cyst removal  . Ercp     CBD stone  . Breast biopsy 110/16/12      RIGHT BREAST NEEDLE CORE BIOPSPY, MASS 1 O'CLOCK - INVASIVE MAMARY  . Breast lumpectomy 10/25/11    RIGHT BREAST - INVASIVE DUCTAL, SENTINEL LYMPH BIOPSY,( 0/3) NODES NEGATIVE., ER+, PR+, LOW S-PHASE, HER 2 NEU - NO AMPLIFICATION     FAMILY HISTORY: family history includes Breast cancer in her sister; Cancer in her sister; Cirrhosis in her father;  and Heart failure in her mother.   SOCIAL HISTORY:  reports that she has never smoked. She does not have any smokeless tobacco history on file. She reports that she does not drink alcohol or use illicit drugs.   ALLERGIES: Codeine   MEDICATIONS:  Current Outpatient Prescriptions  Medication Sig Dispense Refill  . ALPRAZolam (XANAX) 0.5 MG tablet Take 0.5 mg by mouth at bedtime as needed.       Marland Kitchen aspirin 81 MG tablet Take 81 mg by mouth daily.        Marland Kitchen atorvastatin (LIPITOR) 20 MG tablet Take 20 mg by mouth once.       . cholecalciferol (VITAMIN D) 1000 UNITS tablet Take 2,000 Units by mouth 2 (two) times daily.        . metFORMIN (GLUCOPHAGE-XR) 500 MG 24 hr tablet Take 1,000 mg by mouth 2 (two) times daily.       . Omega-3 Fatty Acids (FISH OIL) 1000 MG CAPS Take 1 each by mouth.        . venlafaxine (EFFEXOR) 25 MG tablet 25 mg once.           REVIEW OF SYSTEMS:  Pertinent items are noted in HPI.    PHYSICAL EXAM:  weight is 154 lb 6.4 oz (70.035 kg). Her temperature is 98.3 F (36.8 C). Her blood pressure is 115/70 and her pulse is 90.   On exam she is well appearing and in no acute distress. Oropharynx is  clear. Pupils equally round and reactive to light and her extraocular movements are intact. Her neck is supple with no palpable cervical or supraclavicular lymphadenopathy. Her breast exam reveals a well-healed lumpectomy scar in the upper inner quadrant of her right breast. Her axillary scar has also healed well. There is no palpable seroma. There are no concerning lesions palpable in the right or left breast. There are no concerning palpable lymph nodes in the axillary regions bilaterally. Her abdomen is soft and nontender. She has no lymphedema in her arms.   LABORATORY DATA:  Lab Results  Component Value Date   WBC 4.0 09/07/2011   HGB 12.6 10/25/2011   HCT 38.5 09/07/2011   MCV 87.6 09/07/2011   PLT 196 09/07/2011   Lab Results  Component Value Date   NA 139  10/24/2011   K 4.7 10/24/2011   CL 102 10/24/2011   CO2 29 10/24/2011   Lab Results  Component Value Date   ALT 15 09/07/2011   AST 17 09/07/2011   ALKPHOS 73 09/07/2011   BILITOT 0.5 09/07/2011      IMPRESSION/PLAN: As I discussed with [name] in October, she is a good candidate for adjuvant radiotherapy. I will treat her over 4 weeks. I will treat her whole breast to 40.05 gray in 15 fractions and then boost the lumpectomy cavity to an additional 10 gray in 5 fractions. She understands that radiotherapy will decrease her chance of a local recurrence and it will be given with curative intent. We spoke about fatigue and skin irritation that can come with radiotherapy acutely and the very rare risk of late toxicity, including lung irritation. We also spoke about cosmetic changes that can result from radiotherapy. She is enthusiastic about proceeding with treatment. I scheduled her for a simulation on January 11 and I plan to start her radiotherapy on January 17. Consent form has been signed and placed in her chart.

## 2011-11-18 ENCOUNTER — Encounter (INDEPENDENT_AMBULATORY_CARE_PROVIDER_SITE_OTHER): Payer: Self-pay | Admitting: General Surgery

## 2011-11-18 ENCOUNTER — Ambulatory Visit (INDEPENDENT_AMBULATORY_CARE_PROVIDER_SITE_OTHER): Payer: BC Managed Care – PPO | Admitting: General Surgery

## 2011-11-18 VITALS — BP 140/88 | HR 90 | Temp 97.6°F | Ht 65.5 in | Wt 156.2 lb

## 2011-11-18 DIAGNOSIS — C50219 Malignant neoplasm of upper-inner quadrant of unspecified female breast: Secondary | ICD-10-CM

## 2011-11-18 NOTE — Patient Instructions (Signed)
No activity limitations

## 2011-11-18 NOTE — Progress Notes (Signed)
Patient returns following right breast lumpectomy and sentinel lymph node biopsy for T1 C. N0 ER-positive carcinoma of the right breast. We reviewed her pathology previously which showed clear margins and negative sentinel lymph nodes. She is getting ready to start radiation therapy and then will be placed on hormone therapy. She reports she is doing well with no difficulty from the surgery.  Exam: Lumpectomy and sentinel load the incisions are well healed without infection, seroma, or other problems.  Assessment and plan: Doing well following lumpectomy and sentinel node biopsy. Therapy plan as above. I will see her back in 6 months.

## 2011-11-25 ENCOUNTER — Ambulatory Visit
Admission: RE | Admit: 2011-11-25 | Discharge: 2011-11-25 | Disposition: A | Discharge status: Home / Self Care | Payer: BC Managed Care – PPO | Source: Ambulatory Visit | Attending: Radiation Oncology | Admitting: Radiation Oncology

## 2011-11-25 ENCOUNTER — Encounter: Payer: Self-pay | Admitting: Radiation Oncology

## 2011-11-25 NOTE — Progress Notes (Signed)
Simulation treatment planning note   Ms. Greiner was taken to the CT simulator today for simulation and treatment planning. She has a history of right breast cancer. She is status post lumpectomy. She will receive whole breast radiotherapy. The patient was laid in the supine position on the treatment table with her arms over her head. Her head was in an Accuform device. I placed adhesive wiring over her lumpectomy scar and around the borders of her breast tissue . High-resolution CT axial imaging was obtained of the patient's chest. An isocenter was placed in her anterior right lung. Skin markings were made and she tolerated the procedure well without any complications.  Treatment planning note: the patient will be treated with opposed tangential fields using MLCs as needed for custom blocks. I plan to prescribe 40.05 gray in 15 fractions to the whole breast followed by a boost of 10 gray in 5 fractions to the lumpectomy cavity.

## 2011-11-25 NOTE — Progress Notes (Signed)
Met with patient to discuss RO billing. ° °Dx: 174.2 Upper-inner quadrant of breast ° °Attending Rad: Dr. Squire ° ° °Rad Tx:  [id number] Extrl Beam °

## 2011-11-28 ENCOUNTER — Other Ambulatory Visit: Payer: Self-pay | Admitting: Gynecology

## 2011-11-30 ENCOUNTER — Telehealth: Payer: Self-pay | Admitting: *Deleted

## 2011-11-30 NOTE — Telephone Encounter (Signed)
left voice message to inform the patient of the new date and time on 01-11-2012 starting at 12:00pm.

## 2011-12-02 ENCOUNTER — Encounter: Payer: Self-pay | Admitting: Radiation Oncology

## 2011-12-02 ENCOUNTER — Ambulatory Visit
Admission: RE | Admit: 2011-12-02 | Discharge: 2011-12-02 | Disposition: A | Discharge status: Home / Self Care | Payer: BC Managed Care – PPO | Source: Ambulatory Visit | Attending: Radiation Oncology | Admitting: Radiation Oncology

## 2011-12-02 NOTE — Progress Notes (Signed)
Simulation Verification Note  NARRATIVE: The patient was laid in the correct position on the treatment table for simulation verification. Portal imaging was obtained and I verified the fields and MLCs to be accurate. The patient tolerated the procedure well.

## 2011-12-05 ENCOUNTER — Ambulatory Visit
Admission: RE | Admit: 2011-12-05 | Discharge: 2011-12-05 | Disposition: A | Discharge status: Home / Self Care | Payer: BC Managed Care – PPO | Source: Ambulatory Visit | Attending: Radiation Oncology | Admitting: Radiation Oncology

## 2011-12-05 ENCOUNTER — Encounter: Payer: Self-pay | Admitting: Radiation Oncology

## 2011-12-05 VITALS — Wt 155.4 lb

## 2011-12-05 DIAGNOSIS — C50219 Malignant neoplasm of upper-inner quadrant of unspecified female breast: Secondary | ICD-10-CM

## 2011-12-05 MED ORDER — ALRA NON-METALLIC DEODORANT (RAD-ONC)
1.0000 "application " | Freq: Once | TOPICAL | Status: AC
  Administered 2011-12-05: 1 via TOPICAL

## 2011-12-05 MED ORDER — RADIAPLEXRX EX GEL
Freq: Once | CUTANEOUS | Status: AC
  Administered 2011-12-05: 11:00:00 via TOPICAL

## 2011-12-05 NOTE — Progress Notes (Signed)
Encounter addended by: Delynn Flavin, RN on: 12/05/2011  6:53 PM<BR>     Documentation filed: Inpatient MAR

## 2011-12-05 NOTE — Progress Notes (Signed)
   Weekly Management Note Current Dose:  267 cGy  Projected Dose: 5005 cGy   Narrative:  The patient presents for routine under treatment assessment.  CBCT/MVCT images/Port film x-rays were reviewed.  The chart was checked. She is doing well without complaints  Physical Findings: Weight: 155 lb 6.4 oz (70.489 kg). Skin exam was deferred today. She is in no acute distress  Impression:  The patient is tolerating radiotherapy.  Plan:  Continue radiotherapy as planned.

## 2011-12-05 NOTE — Progress Notes (Signed)
Post sim ed done; gave pt Radiation and You booklet. All questions answered. 

## 2011-12-06 ENCOUNTER — Ambulatory Visit
Admission: RE | Admit: 2011-12-06 | Discharge: 2011-12-06 | Disposition: A | Discharge status: Home / Self Care | Payer: BC Managed Care – PPO | Source: Ambulatory Visit | Attending: Radiation Oncology | Admitting: Radiation Oncology

## 2011-12-07 ENCOUNTER — Ambulatory Visit
Admission: RE | Admit: 2011-12-07 | Discharge: 2011-12-07 | Disposition: A | Discharge status: Home / Self Care | Payer: BC Managed Care – PPO | Source: Ambulatory Visit | Attending: Radiation Oncology | Admitting: Radiation Oncology

## 2011-12-08 ENCOUNTER — Ambulatory Visit
Admission: RE | Admit: 2011-12-08 | Discharge: 2011-12-08 | Disposition: A | Discharge status: Home / Self Care | Payer: BC Managed Care – PPO | Source: Ambulatory Visit | Attending: Radiation Oncology | Admitting: Radiation Oncology

## 2011-12-08 NOTE — Progress Notes (Signed)
Encounter addended by: Glennie Hawk, RN on: 12/08/2011  9:14 AM<BR>     Documentation filed: Inpatient Patient Education

## 2011-12-09 ENCOUNTER — Ambulatory Visit
Admission: RE | Admit: 2011-12-09 | Discharge: 2011-12-09 | Disposition: A | Discharge status: Home / Self Care | Payer: BC Managed Care – PPO | Source: Ambulatory Visit | Attending: Radiation Oncology | Admitting: Radiation Oncology

## 2011-12-12 ENCOUNTER — Encounter: Payer: Self-pay | Admitting: Radiation Oncology

## 2011-12-12 ENCOUNTER — Ambulatory Visit
Admission: RE | Admit: 2011-12-12 | Discharge: 2011-12-12 | Disposition: A | Discharge status: Home / Self Care | Payer: BC Managed Care – PPO | Source: Ambulatory Visit | Attending: Radiation Oncology | Admitting: Radiation Oncology

## 2011-12-12 VITALS — BP 120/69 | HR 70 | Temp 97.9°F | Wt 157.7 lb

## 2011-12-12 DIAGNOSIS — C50219 Malignant neoplasm of upper-inner quadrant of unspecified female breast: Secondary | ICD-10-CM

## 2011-12-12 MED ORDER — BIAFINE EX EMUL
CUTANEOUS | Status: DC | PRN
  Administered 2011-12-12: 1 via TOPICAL

## 2011-12-12 NOTE — Progress Notes (Signed)
6/15 fractions to right breast.  Bright Erythema with rash-like area in the upper, medial portion of the right breast.  Denies any pain nor itching.  Skin intact.  [name] reports notes need to rest at ~ 2pm daily so she lies down at this time to "rejuvenate".  No other voiced concerns

## 2011-12-12 NOTE — Progress Notes (Signed)
Encounter addended by: Delynn Flavin, RN on: 12/12/2011  1:10 PM<BR>     Documentation filed: Inpatient MAR

## 2011-12-12 NOTE — Progress Notes (Signed)
   Weekly Management Note Current Dose:  1602 cGy  Projected Dose: 5005 cGy   Narrative:  The patient presents for routine under treatment assessment.  CBCT/MVCT images/Port film x-rays were reviewed.  The chart was checked. She has a pruritic "rash" over the upper inner R breast.  Physical Findings: Weight: 157 lb 11.2 oz (71.532 kg). Evidence of radiation dermatitis in upper inner R breast. Otherwise, there is mild erythema throughout the R breast.  Impression:  The patient is tolerating radiotherapy.  She is not on any exacerbating medications to propagate her dermatitis.  Plan:  Continue radiotherapy as planned. Will switch to Biafine for skin.

## 2011-12-13 ENCOUNTER — Ambulatory Visit
Admission: RE | Admit: 2011-12-13 | Discharge: 2011-12-13 | Disposition: A | Discharge status: Home / Self Care | Payer: BC Managed Care – PPO | Source: Ambulatory Visit | Attending: Radiation Oncology | Admitting: Radiation Oncology

## 2011-12-14 ENCOUNTER — Ambulatory Visit
Admission: RE | Admit: 2011-12-14 | Discharge: 2011-12-14 | Disposition: A | Discharge status: Home / Self Care | Payer: BC Managed Care – PPO | Source: Ambulatory Visit | Attending: Radiation Oncology | Admitting: Radiation Oncology

## 2011-12-15 ENCOUNTER — Ambulatory Visit
Admission: RE | Admit: 2011-12-15 | Discharge: 2011-12-15 | Disposition: A | Discharge status: Home / Self Care | Payer: BC Managed Care – PPO | Source: Ambulatory Visit | Attending: Radiation Oncology | Admitting: Radiation Oncology

## 2011-12-16 ENCOUNTER — Ambulatory Visit
Admission: RE | Admit: 2011-12-16 | Discharge: 2011-12-16 | Disposition: A | Discharge status: Home / Self Care | Payer: BC Managed Care – PPO | Source: Ambulatory Visit | Attending: Radiation Oncology | Admitting: Radiation Oncology

## 2011-12-16 ENCOUNTER — Encounter: Payer: Self-pay | Admitting: Radiation Oncology

## 2011-12-16 NOTE — Progress Notes (Signed)
Simulation and treatment planning note  [name] will receive an electron boost to her lumpectomy cavity. I used her CT simulation images to create a plan. A special port plan was requested from dosimetry. I plan to treat her right lumpectomy with 9 MeV electrons prescribed to the 98% isodose line. I am treating the lumpectomy cavity with 2 cm margin. A custom electron cutout will be made for her. I am prescribing 10 gray in 5 fractions.

## 2011-12-19 ENCOUNTER — Encounter: Payer: Self-pay | Admitting: Radiation Oncology

## 2011-12-19 ENCOUNTER — Ambulatory Visit
Admission: RE | Admit: 2011-12-19 | Discharge: 2011-12-19 | Disposition: A | Discharge status: Home / Self Care | Payer: BC Managed Care – PPO | Source: Ambulatory Visit | Attending: Radiation Oncology | Admitting: Radiation Oncology

## 2011-12-19 VITALS — BP 123/78 | HR 67 | Temp 98.1°F | Wt 156.0 lb

## 2011-12-19 DIAGNOSIS — C50219 Malignant neoplasm of upper-inner quadrant of unspecified female breast: Secondary | ICD-10-CM

## 2011-12-19 NOTE — Progress Notes (Signed)
   Weekly Management Note Current Dose:   2937cGy  Projected Dose: 5005 cGy   Narrative:  The patient presents for routine under treatment assessment.  CBCT/MVCT images/Port film x-rays were reviewed.  The chart was checked. SHe continues to have some skin irritation which is tolerable. Her right arm is somewhat sore after doing manual labor at home this weekend. Denies any lymphedema.  Physical Findings: Weight: 156 lb (70.761 kg).  Filed Vitals:   12/19/11 1129  BP: 123/78  Pulse: 67  Temp: 98.1 F (36.7 C)  Weight: 156 lb (70.761 kg)   She has radiation dermatitis over her right breast. Her skin is intact. Has no lymphedema in her arms.  Impression:  The patient is tolerating radiotherapy.  Plan:  Continue radiotherapy as planned. She is advised to decrease any strenuous manual labor while recovering from surgery and tolerating the acute effects of radiation. I explained to her that my colleague will see her next week while I am on vacation. I've ordered a one month followup for her as well.

## 2011-12-19 NOTE — Progress Notes (Signed)
11/15 fractions to whole breast.   Erythema noted.  Rash -like appearance to breast superiorly, and medially.  C/o intermittent burning right posterior lateral back, but no evidence of redness, rash nor swelling.  Using Biafine in treatment filed.  States energy level better than on Friday of last week.  Co/ soreness of right upper arm since she was pulling up carpet in her house this weekend.

## 2011-12-20 ENCOUNTER — Ambulatory Visit
Admission: RE | Admit: 2011-12-20 | Discharge: 2011-12-20 | Disposition: A | Discharge status: Home / Self Care | Payer: BC Managed Care – PPO | Source: Ambulatory Visit | Attending: Radiation Oncology | Admitting: Radiation Oncology

## 2011-12-21 ENCOUNTER — Ambulatory Visit
Admission: RE | Admit: 2011-12-21 | Discharge: 2011-12-21 | Disposition: A | Discharge status: Home / Self Care | Payer: BC Managed Care – PPO | Source: Ambulatory Visit | Attending: Radiation Oncology | Admitting: Radiation Oncology

## 2011-12-22 ENCOUNTER — Ambulatory Visit
Admission: RE | Admit: 2011-12-22 | Discharge: 2011-12-22 | Disposition: A | Discharge status: Home / Self Care | Payer: BC Managed Care – PPO | Source: Ambulatory Visit | Attending: Radiation Oncology | Admitting: Radiation Oncology

## 2011-12-23 ENCOUNTER — Ambulatory Visit
Admission: RE | Admit: 2011-12-23 | Discharge: 2011-12-23 | Disposition: A | Discharge status: Home / Self Care | Payer: BC Managed Care – PPO | Source: Ambulatory Visit | Attending: Radiation Oncology | Admitting: Radiation Oncology

## 2011-12-26 ENCOUNTER — Ambulatory Visit
Admission: RE | Admit: 2011-12-26 | Discharge: 2011-12-26 | Disposition: A | Discharge status: Home / Self Care | Payer: BC Managed Care – PPO | Source: Ambulatory Visit | Attending: Radiation Oncology | Admitting: Radiation Oncology

## 2011-12-26 VITALS — BP 131/70 | HR 67 | Temp 98.0°F | Wt 156.0 lb

## 2011-12-26 DIAGNOSIS — C50219 Malignant neoplasm of upper-inner quadrant of unspecified female breast: Secondary | ICD-10-CM

## 2011-12-26 NOTE — Progress Notes (Signed)
Weekly Management Note:  Site:R Breast Current Dose:  4205  cGy Projected Dose: 5005  cGy  Narrative: The patient is seen today for routine under treatment assessment. CBCT/MVCT images/port films were reviewed. The chart was reviewed.   She is without complaints today. During the early part of her treatment she apparently developed an allergic reaction to Radioplex gel. She is now using Biafine cream without incident.  Physical Examination:  Filed Vitals:   12/26/11 1104  BP: 131/70  Pulse: 67  Temp: 98 F (36.7 C)  .  Weight: 156 lb (70.761 kg). There is an erythematous papular rash along the entire breast. There are no areas of dry or moist desquamation.  Impression: Tolerating radiation therapy well. I will check her skin one more time this week (Thursday).  Plan: Continue radiation therapy as planned.

## 2011-12-26 NOTE — Progress Notes (Signed)
1/ 5 fractions for boost to right breast.  Denies any pain.  Bright erythema with rash like appearance to right inner upper quadrant.  C/o " mild" itching.  Using Biafine. States "good" energy level as "lomg as I keep moving", but states once she stops "I am done".  Will complete on Friday.  Given new tube of Biafine.

## 2011-12-27 ENCOUNTER — Ambulatory Visit
Admission: RE | Admit: 2011-12-27 | Discharge: 2011-12-27 | Disposition: A | Discharge status: Home / Self Care | Payer: BC Managed Care – PPO | Source: Ambulatory Visit | Attending: Radiation Oncology | Admitting: Radiation Oncology

## 2011-12-28 ENCOUNTER — Ambulatory Visit
Admission: RE | Admit: 2011-12-28 | Discharge: 2011-12-28 | Disposition: A | Discharge status: Home / Self Care | Payer: BC Managed Care – PPO | Source: Ambulatory Visit | Attending: Radiation Oncology | Admitting: Radiation Oncology

## 2011-12-29 ENCOUNTER — Ambulatory Visit
Admission: RE | Admit: 2011-12-29 | Discharge: 2011-12-29 | Disposition: A | Discharge status: Home / Self Care | Payer: BC Managed Care – PPO | Source: Ambulatory Visit | Attending: Radiation Oncology | Admitting: Radiation Oncology

## 2011-12-29 ENCOUNTER — Encounter: Payer: Self-pay | Admitting: Radiation Oncology

## 2011-12-29 NOTE — Progress Notes (Signed)
Weekly Management Note:  Site:R Breast (boost) Current Dose:  4805  cGy Projected Dose: 5005  cGy  Narrative: The patient is seen today for routine under treatment assessment. CBCT/MVCT images/port films were reviewed. The chart was reviewed.   No complaints today.  Physical Examination: There were no vitals filed for this visit..  Weight:  . No change in her radiation dermatitis. Patchy dry desquamation with no areas of moist desquamation.  Impression: Tolerating radiation therapy well.  Plan: Continue radiation therapy as planned. She will finish her radiation therapy tomorrow. She will have a one-month followup visit with Dr. Basilio Cairo.

## 2011-12-30 ENCOUNTER — Ambulatory Visit
Admission: RE | Admit: 2011-12-30 | Discharge: 2011-12-30 | Disposition: A | Discharge status: Home / Self Care | Payer: BC Managed Care – PPO | Source: Ambulatory Visit | Attending: Radiation Oncology | Admitting: Radiation Oncology

## 2011-12-30 ENCOUNTER — Ambulatory Visit: Payer: BC Managed Care – PPO

## 2012-01-02 ENCOUNTER — Ambulatory Visit: Payer: BC Managed Care – PPO

## 2012-01-03 ENCOUNTER — Ambulatory Visit: Payer: BC Managed Care – PPO

## 2012-01-04 ENCOUNTER — Ambulatory Visit: Payer: BC Managed Care – PPO

## 2012-01-05 ENCOUNTER — Ambulatory Visit: Payer: BC Managed Care – PPO

## 2012-01-06 ENCOUNTER — Ambulatory Visit: Payer: BC Managed Care – PPO

## 2012-01-06 ENCOUNTER — Encounter: Payer: Self-pay | Admitting: Radiation Oncology

## 2012-01-06 NOTE — Progress Notes (Signed)
[facility] Radiation Oncology  Name:[name]  Date:01/06/2012           ZOX:[id number] DOB:12-Feb-1946   Status:outpatient    DIAGNOSIS: Cancer of upper-inner quadrant of female breast. right breast invasive ductal carcinoma; pathologic stage TI C. N0 M0, ER/PR positive HER-2/neu negative, grade 1     INDICATION FOR TREATMENT: Curative   TREATMENT DATES: 12-05-11 to 12-30-11                            SITE/DOSE:              1) Right Breast 2) Right Breast Boost              BEAMS/ENERGY:                 1) Tangents, forward planned/ 10 and 6 MV photons 2) Electron boost / 9 MeV electrons  NARRATIVE:   She tolerated treatments very well with some dry desquamation over her breast.                        PLAN: Routine followup in one month. Patient instructed to call if questions or worsening complaints in interim.

## 2012-01-06 NOTE — Progress Notes (Signed)
Addendum to end of treatment summary: Patient received 4005 cGy in 15 fractions to her R breast followed by a boost of 1000 cGy in 5 fractions to the lumpectomy cavity

## 2012-01-09 ENCOUNTER — Ambulatory Visit: Payer: BC Managed Care – PPO

## 2012-01-10 ENCOUNTER — Ambulatory Visit: Payer: BC Managed Care – PPO

## 2012-01-11 ENCOUNTER — Encounter: Payer: Self-pay | Admitting: Oncology

## 2012-01-11 ENCOUNTER — Telehealth: Payer: Self-pay | Admitting: *Deleted

## 2012-01-11 ENCOUNTER — Ambulatory Visit: Payer: BC Managed Care – PPO

## 2012-01-11 ENCOUNTER — Ambulatory Visit (HOSPITAL_BASED_OUTPATIENT_CLINIC_OR_DEPARTMENT_OTHER): Payer: BC Managed Care – PPO | Admitting: Oncology

## 2012-01-11 ENCOUNTER — Other Ambulatory Visit: Payer: BC Managed Care – PPO | Admitting: Lab

## 2012-01-11 VITALS — BP 125/83 | HR 79 | Temp 98.8°F | Ht 65.5 in | Wt 153.3 lb

## 2012-01-11 DIAGNOSIS — C50219 Malignant neoplasm of upper-inner quadrant of unspecified female breast: Secondary | ICD-10-CM

## 2012-01-11 DIAGNOSIS — Z79811 Long term (current) use of aromatase inhibitors: Secondary | ICD-10-CM

## 2012-01-11 HISTORY — DX: Long term (current) use of aromatase inhibitors: Z79.811

## 2012-01-11 LAB — CBC WITH DIFFERENTIAL/PLATELET
BASO%: 0.7 % (ref 0.0–2.0)
Basophils Absolute: 0 10*3/uL (ref 0.0–0.1)
HCT: 37.8 % (ref 34.8–46.6)
HGB: 12.7 g/dL (ref 11.6–15.9)
MONO#: 0.3 10*3/uL (ref 0.1–0.9)
NEUT%: 57.8 % (ref 38.4–76.8)
RDW: 15 % — ABNORMAL HIGH (ref 11.2–14.5)
WBC: 3.1 10*3/uL — ABNORMAL LOW (ref 3.9–10.3)
lymph#: 0.9 10*3/uL (ref 0.9–3.3)

## 2012-01-11 LAB — COMPREHENSIVE METABOLIC PANEL
ALT: 16 U/L (ref 0–35)
Albumin: 4.3 g/dL (ref 3.5–5.2)
BUN: 19 mg/dL (ref 6–23)
CO2: 24 mEq/L (ref 19–32)
Calcium: 9.4 mg/dL (ref 8.4–10.5)
Chloride: 102 mEq/L (ref 96–112)
Creatinine, Ser: 1.04 mg/dL (ref 0.50–1.10)
Potassium: 4.1 mEq/L (ref 3.5–5.3)

## 2012-01-11 MED ORDER — ANASTROZOLE 1 MG PO TABS: 1.0000 mg | ORAL_TABLET | Freq: Every day | ORAL | Status: DC

## 2012-01-11 NOTE — Patient Instructions (Signed)

## 2012-01-11 NOTE — Progress Notes (Signed)
OFFICE PROGRESS NOTE  CC  Dr. Glenna Fellows Dr. Elfredia Nevins Dr. Beather Arbour  DIAGNOSIS:  66 year old female with stage I (T1 N0) invasive mammary carcinoma of the right breast diagnosed originally 08/30/2011. Patient is now status post right breast lumpectomy with sentinel node biopsy.   PRIOR THERAPY:  #1 patient is status post right breast lumpectomy performed on 10/25/2011. The final pathology revealed a 1.2 cm invasive ductal carcinoma without lymphovascular invasion.. There was noted to be perineural involvement 3 sentinel nodes were negative for metastatic disease. Tumor was strongly estrogen receptor +100% progesterone receptor +100% HER-2/neu negative proliferation marker 7% pathologic staging T1 C. N0  #2 S/P radiation therapy to the right breast 12/05/11 - 12/30/11  #3 Begin Arimidex 1 mg po daily beginning 01/11/12  CURRENT THERAPY: Arimidex 1 mg daily starting today  INTERVAL HISTORY: [name] 66 y.o. female returns forfollowup visit today. overall she is doing well she tolerated the radiation quite well.She is without any complaints no nausea vomiting fevers chills night sweats no breast tenderness. She is a little fatigued and her breast does show some redness at the site of the radiation ports. She denies any headaches, no changes in her bowel or bladder habits. She has not had any bleeding, denies any joint aches or pains.  Remainder of the 10 point review of systems is negative.  MEDICAL HISTORY: Past Medical History  Diagnosis Date  . Hyperlipidemia   . Diabetes mellitus   . Breast cancer     INVASIVE MAMMARY DX - 08/30/11  . Arthritis   . GERD (gastroesophageal reflux disease)   . Cough     ALLERGIES:  is allergic to codeine.  MEDICATIONS:  Current Outpatient Prescriptions  Medication Sig Dispense Refill  . ALPRAZolam (XANAX) 0.5 MG tablet Take 0.5 mg by mouth at bedtime as needed.       Marland Kitchen aspirin 81 MG tablet Take 81 mg by mouth daily.         Marland Kitchen atorvastatin (LIPITOR) 20 MG tablet Take 20 mg by mouth once.       Marland Kitchen emollient (BIAFINE) cream Apply topically as needed.      Marland Kitchen HYDROcodone-acetaminophen (NORCO) 5-325 MG per tablet Take 1 tablet by mouth every 6 (six) hours as needed.      . metFORMIN (GLUCOPHAGE-XR) 500 MG 24 hr tablet Take 1,000 mg by mouth 2 (two) times daily.       . Omega-3 Fatty Acids (FISH OIL) 1000 MG CAPS Take 1 each by mouth.        . venlafaxine (EFFEXOR) 25 MG tablet 25 mg once.       Marland Kitchen anastrozole (ARIMIDEX) 1 MG tablet Take 1 tablet (1 mg total) by mouth daily.  90 tablet  12  . cholecalciferol (VITAMIN D) 1000 UNITS tablet Take 2,000 Units by mouth 2 (two) times daily.          SURGICAL HISTORY:  Past Surgical History  Procedure Date  . Cholecystectomy   . Oophorectomy     with a cyst removal  . Ercp     CBD stone  . Breast biopsy 110/16/12      RIGHT BREAST NEEDLE CORE BIOPSPY, MASS 1 O'CLOCK - INVASIVE MAMARY  . Breast lumpectomy 10/25/11    RIGHT BREAST - INVASIVE DUCTAL, SENTINEL LYMPH BIOPSY,( 0/3) NODES NEGATIVE., ER+, PR+, LOW S-PHASE, HER 2 NEU - NO AMPLIFICATION  . Ovary removed   . Stone in bile duct     REVIEW OF SYSTEMS:  Pertinent items are noted in HPI.   PHYSICAL EXAMINATION: General appearance: alert, cooperative and appears stated age Head: Normocephalic, without obvious abnormality, atraumatic Neck: no adenopathy, no carotid bruit, no JVD, supple, symmetrical, trachea midline and thyroid not enlarged, symmetric, no tenderness/mass/nodules Lymph nodes: Cervical, supraclavicular, and axillary nodes normal. Resp: clear to auscultation bilaterally and normal percussion bilaterally Back: symmetric, no curvature. ROM normal. No CVA tenderness. Cardio: regular rate and rhythm, S1, S2 normal, no murmur, click, rub or gallop GI: soft, non-tender; bowel sounds normal; no masses,  no organomegaly Extremities: extremities normal, atraumatic, no cyanosis or edema Neurologic: Alert  and oriented X 3, normal strength and tone. Normal symmetric reflexes. Normal coordination and gait Bilateral breasts are examined left breast no masses nipple discharge right breast reveals healing surgical scar there is a little bit of nipple retraction otherwise no other findings. ECOG PERFORMANCE STATUS: 0 - Asymptomatic  Blood pressure 125/83, pulse 79, temperature 98.8 F (37.1 C), temperature source Oral, height 5' 5.5" (1.664 m), weight 153 lb 4.8 oz (69.536 kg).  LABORATORY DATA: Lab Results  Component Value Date   WBC 3.1* 01/11/2012   HGB 12.7 01/11/2012   HCT 37.8 01/11/2012   MCV 87.2 01/11/2012   PLT 177 01/11/2012      Chemistry      Component Value Date/Time   NA 139 10/24/2011 1300   K 4.7 10/24/2011 1300   CL 102 10/24/2011 1300   CO2 29 10/24/2011 1300   BUN 17 10/24/2011 1300   CREATININE 0.71 10/24/2011 1300      Component Value Date/Time   CALCIUM 9.6 10/24/2011 1300   ALKPHOS 73 09/07/2011 1158   AST 17 09/07/2011 1158   ALT 15 09/07/2011 1158   BILITOT 0.5 09/07/2011 1158       RADIOGRAPHIC STUDIES: ASSESSMENT: 66 year old female with:  1. stage I invasive ductal carcinoma of the right breast status post lumpectomy with sentinel node biopsy. The final pathology revealing a T1 C. N0 rest cancer. ER positive PR positive HER-2/neu negative with a proliferation marker of 7%.  2. She has complete her radiation to the breast and did very well with it.  3. Begin arimidex starting today.  PLAN:   1. Start arimidex today. Prescription sent to her pharmacy.  2. Risks and benefits and rational explained to the patient in detail.  3. Follow up in 2months time.  4. She knows to call with any problems if they occur sooner   All questions were answered. The patient knows to call the clinic with any problems, questions or concerns. We can certainly see the patient much sooner if necessary.  I spent 30 minutes counseling the patient face to face. The total  time spent in the appointment was 30 minutes.    Drue Second, MD Medical/Oncology [facility]3 [phone] (beeper) [phone] (Office)  01/11/2012, 4:36 PM

## 2012-01-11 NOTE — Telephone Encounter (Signed)
gave patient appointment for 02-2012. printed out calendar and gave to the patient 

## 2012-01-12 ENCOUNTER — Other Ambulatory Visit: Payer: BC Managed Care – PPO | Admitting: Lab

## 2012-01-12 ENCOUNTER — Ambulatory Visit: Payer: BC Managed Care – PPO | Admitting: Oncology

## 2012-01-12 ENCOUNTER — Ambulatory Visit: Payer: BC Managed Care – PPO

## 2012-01-13 ENCOUNTER — Ambulatory Visit: Payer: BC Managed Care – PPO

## 2012-01-16 ENCOUNTER — Ambulatory Visit: Payer: BC Managed Care – PPO

## 2012-01-17 ENCOUNTER — Ambulatory Visit: Payer: BC Managed Care – PPO

## 2012-01-18 ENCOUNTER — Ambulatory Visit: Payer: BC Managed Care – PPO

## 2012-02-02 ENCOUNTER — Encounter: Payer: Self-pay | Admitting: *Deleted

## 2012-02-03 ENCOUNTER — Encounter: Payer: Self-pay | Admitting: Radiation Oncology

## 2012-02-03 ENCOUNTER — Ambulatory Visit
Admission: RE | Admit: 2012-02-03 | Discharge: 2012-02-03 | Disposition: A | Discharge status: Home / Self Care | Payer: BC Managed Care – PPO | Source: Ambulatory Visit | Attending: Radiation Oncology | Admitting: Radiation Oncology

## 2012-02-03 VITALS — BP 124/72 | HR 70 | Temp 98.0°F | Wt 156.2 lb

## 2012-02-03 DIAGNOSIS — C50219 Malignant neoplasm of upper-inner quadrant of unspecified female breast: Secondary | ICD-10-CM

## 2012-02-03 NOTE — Progress Notes (Signed)
[facility] Radiation Oncology Follow up Note  Name: [name]   Date: 02/03/2012    MRN: [id number] DOB: 1946-07-30  CC:  Cassell Smiles., MD, MD  DIAGNOSIS: T1 CN 0M0 ER/PR positive HER-2/neu negative grade 1 right breast cancer    INTERVAL SINCE LAST RADIATION: 5 weeks   NARRATIVE: The patient is doing very well. She is taking her Arimidex and tolerating that well. She is currently renovating her house. Her energy is good.  ALLERGIES: Codeine   MEDICATIONS:  Current Outpatient Prescriptions  Medication Sig Dispense Refill  . ALPRAZolam (XANAX) 0.5 MG tablet Take 0.5 mg by mouth at bedtime as needed.       Marland Kitchen anastrozole (ARIMIDEX) 1 MG tablet Take 1 tablet (1 mg total) by mouth daily.  90 tablet  12  . aspirin 81 MG tablet Take 81 mg by mouth daily.        Marland Kitchen atorvastatin (LIPITOR) 20 MG tablet Take 20 mg by mouth once.       . cholecalciferol (VITAMIN D) 1000 UNITS tablet Take 2,000 Units by mouth 2 (two) times daily.        Marland Kitchen HYDROcodone-acetaminophen (NORCO) 5-325 MG per tablet Take 1 tablet by mouth every 6 (six) hours as needed.      . metFORMIN (GLUCOPHAGE-XR) 500 MG 24 hr tablet Take 1,000 mg by mouth 2 (two) times daily.       . Omega-3 Fatty Acids (FISH OIL) 1000 MG CAPS Take 1 each by mouth.        . venlafaxine (EFFEXOR) 25 MG tablet 25 mg once.            PHYSICAL EXAM:   weight is 156 lb 3.2 oz (70.852 kg). Her temperature is 98 F (36.7 C). Her blood pressure is 124/72 and her pulse is 70.  Her right breast is healed well. There is some residual hyperpigmentation. The skin is intact. It is slightly dry.   LABORATORY DATA:  Lab Results  Component Value Date   WBC 3.1* 01/11/2012   HGB 12.7 01/11/2012   HCT 37.8 01/11/2012   MCV 87.2 01/11/2012   PLT 177 01/11/2012   CBC    Component Value Date/Time   WBC 3.1* 01/11/2012 1200   RBC 4.34 01/11/2012 1200   HGB 12.7 01/11/2012 1200   HGB 12.6 10/25/2011 1013   HCT 37.8 01/11/2012 1200     PLT 177 01/11/2012 1200   MCV 87.2 01/11/2012 1200   MCH 29.2 01/11/2012 1200   MCHC 33.5 01/11/2012 1200   RDW 15.0* 01/11/2012 1200   LYMPHSABS 0.9 01/11/2012 1200   MONOABS 0.3 01/11/2012 1200   EOSABS 0.1 01/11/2012 1200   BASOSABS 0.0 01/11/2012 1200    CMP     Component Value Date/Time   NA 138 01/11/2012 1200   K 4.1 01/11/2012 1200   CL 102 01/11/2012 1200   CO2 24 01/11/2012 1200   GLUCOSE 167* 01/11/2012 1200   BUN 19 01/11/2012 1200   CREATININE 1.04 01/11/2012 1200   CALCIUM 9.4 01/11/2012 1200   PROT 6.5 01/11/2012 1200   ALBUMIN 4.3 01/11/2012 1200   AST 21 01/11/2012 1200   ALT 16 01/11/2012 1200   ALKPHOS 58 01/11/2012 1200   BILITOT 0.5 01/11/2012 1200   GFRNONAA 89* 10/24/2011 1300   GFRAA >90 10/24/2011 1300     IMPRESSION/PLAN: She is doing well approximately one month post-radiotherapy. She will continue Arimidex. I recommended vitamin E cream for her breast. She  has an excellent prognosis. I told the patient that I am happy to see her back on an as-needed basis. She has my contact information if she ever wants to be worked back in my schedule. Otherwise she'll be followed regularly by medical oncology as she continues her antiestrogen therapy.

## 2012-02-03 NOTE — Progress Notes (Signed)
First FU since end of treatment.  Right breast with mild erythema and mild swelling in the areola region.  Skin intact and soft.  Reports intermittent, "mild" soreness and tightness right breast near the axilla.  Increased energy level and walking again.  Started Arimidex on 01/11/12

## 2012-03-15 ENCOUNTER — Other Ambulatory Visit (HOSPITAL_BASED_OUTPATIENT_CLINIC_OR_DEPARTMENT_OTHER): Payer: BC Managed Care – PPO | Admitting: Lab

## 2012-03-15 ENCOUNTER — Ambulatory Visit (HOSPITAL_BASED_OUTPATIENT_CLINIC_OR_DEPARTMENT_OTHER): Payer: BC Managed Care – PPO | Admitting: Oncology

## 2012-03-15 ENCOUNTER — Telehealth: Payer: Self-pay | Admitting: *Deleted

## 2012-03-15 ENCOUNTER — Encounter: Payer: Self-pay | Admitting: Oncology

## 2012-03-15 VITALS — BP 111/72 | HR 91 | Temp 99.2°F | Ht 65.5 in | Wt 152.5 lb

## 2012-03-15 DIAGNOSIS — C50219 Malignant neoplasm of upper-inner quadrant of unspecified female breast: Secondary | ICD-10-CM

## 2012-03-15 DIAGNOSIS — Z170 Estrogen receptor positive status [ER+]: Secondary | ICD-10-CM

## 2012-03-15 LAB — CBC WITH DIFFERENTIAL/PLATELET
BASO%: 0.3 % (ref 0.0–2.0)
Basophils Absolute: 0 10*3/uL (ref 0.0–0.1)
HCT: 39.6 % (ref 34.8–46.6)
HGB: 13.3 g/dL (ref 11.6–15.9)
LYMPH%: 16.5 % (ref 14.0–49.7)
MCH: 29.5 pg (ref 25.1–34.0)
MCHC: 33.6 g/dL (ref 31.5–36.0)
MONO#: 0.2 10*3/uL (ref 0.1–0.9)
NEUT%: 76.3 % (ref 38.4–76.8)
Platelets: 182 10*3/uL (ref 145–400)
WBC: 4 10*3/uL (ref 3.9–10.3)

## 2012-03-15 NOTE — Patient Instructions (Signed)
1. You are doing well. 2. For aches and pains try over the counter ibuprofen and see if it helps. 3. I will see you back in 09/28/12

## 2012-03-15 NOTE — Progress Notes (Signed)
OFFICE PROGRESS NOTE  CC  Dr. Glenna Fellows Dr. Elfredia Nevins Dr. Beather Arbour  DIAGNOSIS:  66 year old female with stage I (T1 N0) invasive mammary carcinoma of the right breast diagnosed originally 08/30/2011. Patient is now status post right breast lumpectomy with sentinel node biopsy.   PRIOR THERAPY:  #1 patient is status post right breast lumpectomy performed on 10/25/2011. The final pathology revealed a 1.2 cm invasive ductal carcinoma without lymphovascular invasion.. There was noted to be perineural involvement 3 sentinel nodes were negative for metastatic disease. Tumor was strongly estrogen receptor +100% progesterone receptor +100% HER-2/neu negative proliferation marker 7% pathologic staging T1 C. N0  #2 S/P radiation therapy to the right breast 12/05/11 - 12/30/11  #3 Began Arimidex 1 mg po daily beginning 01/11/12  CURRENT THERAPY: Arimidex 1 mg daily   INTERVAL HISTORY: [name] 66 y.o. female returns forfollowup visit today. Clinically patient seems to be doing well. Her main complaint is some hot flashes. She tells me that she had been experiencing these all along for the last 40 years. However she had noticed that the intensity has increased when she is having them. She otherwise is denying any headaches double vision blurring of vision. She is having some aches and pains including in the ankles which knee as well as hip joints. She does tell me that they have been trying to finish a house in to which they're going to be moving and there is a lot of: Up and down and she may related to diet but I do think that Arimidex may be contributing to this as well.  Remainder of the 10 point review of systems is negative.  MEDICAL HISTORY: Past Medical History  Diagnosis Date  . Hyperlipidemia   . Diabetes mellitus   . Breast cancer     INVASIVE MAMMARY DX - 08/30/11  . Arthritis   . GERD (gastroesophageal reflux disease)   . Cough   . S/P radiation therapy  12/05/11 - 12/30/11    Right Breast/Right Breast Boost  . Use of aromatase inhibitors 01/11/12    Arimidex - Dr. Drue Second    ALLERGIES:  is allergic to codeine.  MEDICATIONS:  Current Outpatient Prescriptions  Medication Sig Dispense Refill  . ALPRAZolam (XANAX) 0.5 MG tablet Take 0.5 mg by mouth at bedtime as needed.       Marland Kitchen aspirin 81 MG tablet Take 81 mg by mouth daily.        Marland Kitchen atorvastatin (LIPITOR) 20 MG tablet Take 20 mg by mouth once.       . cholecalciferol (VITAMIN D) 1000 UNITS tablet Take 2,000 Units by mouth 2 (two) times daily.        Marland Kitchen HYDROcodone-acetaminophen (NORCO) 5-325 MG per tablet Take 1 tablet by mouth every 6 (six) hours as needed.      . metFORMIN (GLUCOPHAGE-XR) 500 MG 24 hr tablet Take 1,000 mg by mouth 2 (two) times daily.       . Omega-3 Fatty Acids (FISH OIL) 1000 MG CAPS Take 1 each by mouth.        . venlafaxine (EFFEXOR) 25 MG tablet 25 mg once.         SURGICAL HISTORY:  Past Surgical History  Procedure Date  . Cholecystectomy   . Oophorectomy     with a cyst removal  . Ercp     CBD stone  . Breast biopsy 110/16/12      RIGHT BREAST NEEDLE CORE BIOPSPY, MASS 1 O'CLOCK -  INVASIVE MAMARY  . Breast lumpectomy 10/25/11    RIGHT BREAST - INVASIVE DUCTAL, SENTINEL LYMPH BIOPSY,( 0/3) NODES NEGATIVE., ER+, PR+, LOW S-PHASE, HER 2 NEU - NO AMPLIFICATION  . Ovary removed   . Stone in bile duct     REVIEW OF SYSTEMS:  Pertinent items are noted in HPI.   PHYSICAL EXAMINATION: General appearance: alert, cooperative and appears stated age Head: Normocephalic, without obvious abnormality, atraumatic Neck: no adenopathy, no carotid bruit, no JVD, supple, symmetrical, trachea midline and thyroid not enlarged, symmetric, no tenderness/mass/nodules Lymph nodes: Cervical, supraclavicular, and axillary nodes normal. Resp: clear to auscultation bilaterally and normal percussion bilaterally Back: symmetric, no curvature. ROM normal. No CVA  tenderness. Cardio: regular rate and rhythm, S1, S2 normal, no murmur, click, rub or gallop GI: soft, non-tender; bowel sounds normal; no masses,  no organomegaly Extremities: extremities normal, atraumatic, no cyanosis or edema Neurologic: Alert and oriented X 3, normal strength and tone. Normal symmetric reflexes. Normal coordination and gait Bilateral breasts are examined left breast no masses nipple discharge right breast reveals healing surgical scar there is a little bit of nipple retraction otherwise no other findings. ECOG PERFORMANCE STATUS: 0 - Asymptomatic  Blood pressure 111/72, pulse 91, temperature 99.2 F (37.3 C), temperature source Oral, height 5' 5.5" (1.664 m), weight 152 lb 8 oz (69.174 kg).  LABORATORY DATA: Lab Results  Component Value Date   WBC 4.0 03/15/2012   HGB 13.3 03/15/2012   HCT 39.6 03/15/2012   MCV 87.9 03/15/2012   PLT 182 03/15/2012      Chemistry      Component Value Date/Time   NA 138 01/11/2012 1200   K 4.1 01/11/2012 1200   CL 102 01/11/2012 1200   CO2 24 01/11/2012 1200   BUN 19 01/11/2012 1200   CREATININE 1.04 01/11/2012 1200      Component Value Date/Time   CALCIUM 9.4 01/11/2012 1200   ALKPHOS 58 01/11/2012 1200   AST 21 01/11/2012 1200   ALT 16 01/11/2012 1200   BILITOT 0.5 01/11/2012 1200       RADIOGRAPHIC STUDIES: ASSESSMENT: 66 year old female with:  1. stage I invasive ductal carcinoma of the right breast status post lumpectomy with sentinel node biopsy. The final pathology revealing a T1 C. N0 breast cancer. ER positive PR positive HER-2/neu negative with a proliferation marker of 7%.  2. She has complete her radiation to the breast and did very well with it.  3. Patient is currently on Arimidex 1 mg daily since February 2013. She is tolerating this very well Except for some aches and pains and hot flashes  PLAN:  #1 patient is tolerating Arimidex well except for some aches and pains. I have recommended that she take some ibuprofen as  needed. She may want to take ibuprofen one tab at night with food and 1 tab in the morning with food.  #2 patient is having hot flashes and we discussed the Effexor since she is on this. We also discussed other techniques to do well she is experiencing hot flash to diminished intensity. We certainly could go up on the dose of the Effexor if the hot flashes continue to interfere with her daily living.  #3 I will plan on seeing the patient back in 6 months time or sooner if need arises.  All questions were answered. The patient knows to call the clinic with any problems, questions or concerns. We can certainly see the patient much sooner if necessary.  I spent 30  minutes counseling the patient face to face. The total time spent in the appointment was 30 minutes.    Drue Second, MD Medical/Oncology [facility] [phone] (beeper) [phone] (Office)  03/15/2012, 11:26 AM

## 2012-03-15 NOTE — Telephone Encounter (Signed)
gave patient appointment for 10-08-2012 starting at 10:30am printed out calendar and gave to the patient

## 2012-03-16 LAB — VITAMIN D 25 HYDROXY (VIT D DEFICIENCY, FRACTURES): Vit D, 25-Hydroxy: 76 ng/mL (ref 30–89)

## 2012-03-16 LAB — COMPREHENSIVE METABOLIC PANEL
AST: 21 U/L (ref 0–37)
Albumin: 4.2 g/dL (ref 3.5–5.2)
BUN: 22 mg/dL (ref 6–23)
Calcium: 9.3 mg/dL (ref 8.4–10.5)
Chloride: 101 mEq/L (ref 96–112)
Creatinine, Ser: 0.9 mg/dL (ref 0.50–1.10)
Glucose, Bld: 99 mg/dL (ref 70–99)

## 2012-06-29 ENCOUNTER — Ambulatory Visit (INDEPENDENT_AMBULATORY_CARE_PROVIDER_SITE_OTHER): Payer: Medicare Other | Admitting: General Surgery

## 2012-06-29 VITALS — BP 108/68 | HR 68 | Temp 97.2°F | Resp 12 | Ht 65.0 in | Wt 156.0 lb

## 2012-06-29 DIAGNOSIS — C50219 Malignant neoplasm of upper-inner quadrant of unspecified female breast: Secondary | ICD-10-CM

## 2012-06-29 NOTE — Progress Notes (Signed)
Chief complaint: Followup cancer right breast  History:Patient returns following right breast lumpectomy and sentinel lymph node biopsy for T1 C. N0 ER-positive carcinoma of the right breast now approximately 6 months following diagnosis. She completed radiation therapy and is now on adjuvant tamoxifen. She notices a little bit of tightness under her right arm but has good mobility. No breast lumps or skin changes or nipple discharge or other symptoms of concern.  Exam: BP 108/68  Pulse 68  Temp 97.2 F (36.2 C) (Temporal)  Resp 12  Ht 5\' 5"  (1.651 m)  Wt 156 lb (70.761 kg)  BMI 25.96 kg/m2 General: Appears well Lymph nodes: No cervical, subclavicular, or axillary nodes palpable Extremities: She has full range of motion of her right arm Breasts: Well-healed lumpectomy site in the upper breast with no thickening. No masses in either breast or skin changes or nipple retraction.  Imaging: Due in November  Assessment and plan: Doing well with no evidence of complication or early recurrence. We gave her some stretching exercises to use for her right arm. Return in 6 months

## 2012-07-10 ENCOUNTER — Other Ambulatory Visit (INDEPENDENT_AMBULATORY_CARE_PROVIDER_SITE_OTHER): Payer: Self-pay | Admitting: General Surgery

## 2012-07-10 DIAGNOSIS — Z853 Personal history of malignant neoplasm of breast: Secondary | ICD-10-CM

## 2012-08-13 ENCOUNTER — Ambulatory Visit
Admission: RE | Admit: 2012-08-13 | Discharge: 2012-08-13 | Disposition: A | Discharge status: Home / Self Care | Payer: Medicare Other | Source: Ambulatory Visit | Attending: General Surgery | Admitting: General Surgery

## 2012-08-13 DIAGNOSIS — Z853 Personal history of malignant neoplasm of breast: Secondary | ICD-10-CM

## 2012-10-08 ENCOUNTER — Telehealth: Payer: Self-pay | Admitting: Oncology

## 2012-10-08 ENCOUNTER — Encounter: Payer: Self-pay | Admitting: Oncology

## 2012-10-08 ENCOUNTER — Other Ambulatory Visit (HOSPITAL_BASED_OUTPATIENT_CLINIC_OR_DEPARTMENT_OTHER): Payer: Medicare Other | Admitting: Lab

## 2012-10-08 ENCOUNTER — Ambulatory Visit (HOSPITAL_BASED_OUTPATIENT_CLINIC_OR_DEPARTMENT_OTHER): Payer: Medicare Other | Admitting: Oncology

## 2012-10-08 VITALS — BP 146/79 | HR 75 | Temp 98.3°F | Resp 20 | Ht 65.0 in | Wt 154.8 lb

## 2012-10-08 DIAGNOSIS — C50219 Malignant neoplasm of upper-inner quadrant of unspecified female breast: Secondary | ICD-10-CM

## 2012-10-08 DIAGNOSIS — Z170 Estrogen receptor positive status [ER+]: Secondary | ICD-10-CM

## 2012-10-08 LAB — COMPREHENSIVE METABOLIC PANEL (CC13)
ALT: 13 U/L (ref 0–55)
Albumin: 3.7 g/dL (ref 3.5–5.0)
CO2: 27 mEq/L (ref 22–29)
Chloride: 101 mEq/L (ref 98–107)
Glucose: 114 mg/dl — ABNORMAL HIGH (ref 70–99)
Potassium: 4.2 mEq/L (ref 3.5–5.1)
Sodium: 141 mEq/L (ref 136–145)
Total Bilirubin: 0.56 mg/dL (ref 0.20–1.20)
Total Protein: 6.7 g/dL (ref 6.4–8.3)

## 2012-10-08 LAB — VITAMIN D 25 HYDROXY (VIT D DEFICIENCY, FRACTURES): Vit D, 25-Hydroxy: 49 ng/mL (ref 30–89)

## 2012-10-08 LAB — CBC WITH DIFFERENTIAL/PLATELET
Basophils Absolute: 0 10*3/uL (ref 0.0–0.1)
Eosinophils Absolute: 0.1 10*3/uL (ref 0.0–0.5)
HGB: 12.5 g/dL (ref 11.6–15.9)
MCV: 88.8 fL (ref 79.5–101.0)
MONO#: 0.3 10*3/uL (ref 0.1–0.9)
MONO%: 8.3 % (ref 0.0–14.0)
NEUT#: 2.1 10*3/uL (ref 1.5–6.5)
RBC: 4.15 10*6/uL (ref 3.70–5.45)
RDW: 13.4 % (ref 11.2–14.5)
WBC: 3.8 10*3/uL — ABNORMAL LOW (ref 3.9–10.3)
lymph#: 1.2 10*3/uL (ref 0.9–3.3)

## 2012-10-08 NOTE — Telephone Encounter (Signed)
gve the pt her June 2014 appt calendar 

## 2012-10-08 NOTE — Patient Instructions (Addendum)
Take Bcomplex daily  Continue arimidex  We will see you back in 6 months

## 2012-10-08 NOTE — Progress Notes (Signed)
OFFICE PROGRESS NOTE  CC  Dr. Glenna Fellows Dr. Elfredia Nevins Dr. Beather Arbour  DIAGNOSIS:  66 year old female with stage I (T1 N0) invasive mammary carcinoma of the right breast diagnosed originally 08/30/2011. Patient is now status post right breast lumpectomy with sentinel node biopsy.   PRIOR THERAPY:  #1 patient is status post right breast lumpectomy performed on 10/25/2011. The final pathology revealed a 1.2 cm invasive ductal carcinoma without lymphovascular invasion.. There was noted to be perineural involvement 3 sentinel nodes were negative for metastatic disease. Tumor was strongly estrogen receptor +100% progesterone receptor +100% HER-2/neu negative proliferation marker 7% pathologic staging T1 C. N0  #2 S/P radiation therapy to the right breast 12/05/11 - 12/30/11  #3 Began Arimidex 1 mg po daily beginning 01/11/12  CURRENT THERAPY: Arimidex 1 mg daily   INTERVAL HISTORY: [name] 66 y.o. female returns forfollowup visit today. Clinically patient seems to be doing well. Her main complaint is some hot flashes. She tells me that she had been experiencing these all along for the last 40 years.  She otherwise is denying any headaches double vision blurring of vision. She is having some aches and pains including in the ankles which knee as well as hip joints. She does tell me that they have been trying to finish a house in to which they're going to be moving and there is a lot of: Up and down and she may related to diet but I do think that Arimidex may be contributing to this as well.  Remainder of the 10 point review of systems is negative.  MEDICAL HISTORY: Past Medical History  Diagnosis Date  . Hyperlipidemia   . Diabetes mellitus   . Breast cancer     INVASIVE MAMMARY DX - 08/30/11  . Arthritis   . GERD (gastroesophageal reflux disease)   . Cough   . S/P radiation therapy 12/05/11 - 12/30/11    Right Breast/Right Breast Boost  . Use of aromatase  inhibitors 01/11/12    Arimidex - Dr. Drue Second    ALLERGIES:  is allergic to codeine.  MEDICATIONS:  Current Outpatient Prescriptions  Medication Sig Dispense Refill  . anastrozole (ARIMIDEX) 1 MG tablet       . aspirin 81 MG tablet Take 81 mg by mouth daily.        Marland Kitchen atorvastatin (LIPITOR) 20 MG tablet Take 20 mg by mouth once.       . cholecalciferol (VITAMIN D) 1000 UNITS tablet Take 2,000 Units by mouth 2 (two) times daily.        Marland Kitchen ibuprofen (ADVIL,MOTRIN) 200 MG tablet Take 200 mg by mouth every 6 (six) hours as needed.      . metFORMIN (GLUCOPHAGE-XR) 500 MG 24 hr tablet Take 1,000 mg by mouth 2 (two) times daily.       . Omega-3 Fatty Acids (FISH OIL) 1000 MG CAPS Take 1 each by mouth.        . venlafaxine (EFFEXOR) 25 MG tablet 25 mg once.       . ALPRAZolam (XANAX) 0.5 MG tablet Take 0.5 mg by mouth at bedtime as needed.       Marland Kitchen HYDROcodone-acetaminophen (NORCO) 5-325 MG per tablet Take 1 tablet by mouth every 6 (six) hours as needed.        SURGICAL HISTORY:  Past Surgical History  Procedure Date  . Cholecystectomy   . Oophorectomy     with a cyst removal  . Ercp  CBD stone  . Breast biopsy 110/16/12      RIGHT BREAST NEEDLE CORE BIOPSPY, MASS 1 O'CLOCK - INVASIVE MAMARY  . Breast lumpectomy 10/25/11    RIGHT BREAST - INVASIVE DUCTAL, SENTINEL LYMPH BIOPSY,( 0/3) NODES NEGATIVE., ER+, PR+, LOW S-PHASE, HER 2 NEU - NO AMPLIFICATION  . Ovary removed   . Stone in bile duct     REVIEW OF SYSTEMS:  Pertinent items are noted in HPI.   PHYSICAL EXAMINATION: General appearance: alert, cooperative and appears stated age Head: Normocephalic, without obvious abnormality, atraumatic Neck: no adenopathy, no carotid bruit, no JVD, supple, symmetrical, trachea midline and thyroid not enlarged, symmetric, no tenderness/mass/nodules Lymph nodes: Cervical, supraclavicular, and axillary nodes normal. Resp: clear to auscultation bilaterally and normal percussion  bilaterally Back: symmetric, no curvature. ROM normal. No CVA tenderness. Cardio: regular rate and rhythm, S1, S2 normal, no murmur, click, rub or gallop GI: soft, non-tender; bowel sounds normal; no masses,  no organomegaly Extremities: extremities normal, atraumatic, no cyanosis or edema Neurologic: Alert and oriented X 3, normal strength and tone. Normal symmetric reflexes. Normal coordination and gait Bilateral breasts are examined left breast no masses nipple discharge right breast reveals healing surgical scar there is a little bit of nipple retraction otherwise no other findings. ECOG PERFORMANCE STATUS: 0 - Asymptomatic  Blood pressure 146/79, pulse 75, temperature 98.3 F (36.8 C), temperature source Oral, resp. rate 20, height 5\' 5"  (1.651 m), weight 154 lb 12.8 oz (70.217 kg).  LABORATORY DATA: Lab Results  Component Value Date   WBC 3.8* 10/08/2012   HGB 12.5 10/08/2012   HCT 36.8 10/08/2012   MCV 88.8 10/08/2012   PLT 182 10/08/2012      Chemistry      Component Value Date/Time   NA 136 03/15/2012 1041   K 4.2 03/15/2012 1041   CL 101 03/15/2012 1041   CO2 28 03/15/2012 1041   BUN 22 03/15/2012 1041   CREATININE 0.90 03/15/2012 1041      Component Value Date/Time   CALCIUM 9.3 03/15/2012 1041   ALKPHOS 73 03/15/2012 1041   AST 21 03/15/2012 1041   ALT 16 03/15/2012 1041   BILITOT 1.0 03/15/2012 1041       RADIOGRAPHIC STUDIES: ASSESSMENT: 66 year old female with:  1. stage I invasive ductal carcinoma of the right breast status post lumpectomy with sentinel node biopsy. The final pathology revealing a T1 C. N0 breast cancer. ER positive PR positive HER-2/neu negative with a proliferation marker of 7%.  2. She completed her radiation to the breast and did very well with it.  3. Patient is  on Arimidex 1 mg daily since February 2013. She is tolerating this very well Except for some aches and pains and hot flashes  PLAN:  #1 patient is tolerating Arimidex well except for some  aches and pains. I have recommended that she take some ibuprofen as needed. She may want to take ibuprofen one tab at night with food and 1 tab in the morning with food.  #2 patient is having hot flashes and we discussed the Effexor since she is on this. We also discussed other techniques to do well she is experiencing hot flash to diminished intensity. We certainly could go up on the dose of the Effexor if the hot flashes continue to interfere with her daily living.  #3 I will plan on seeing the patient back in 6 months time or sooner if need arises.  All questions were answered. The patient knows to  call the clinic with any problems, questions or concerns. We can certainly see the patient much sooner if necessary.  I spent 25 minutes counseling the patient face to face. The total time spent in the appointment was 30 minutes.    Drue Second, MD Medical/Oncology [facility] [phone] (beeper) [phone] (Office)  10/08/2012, 10:36 AM

## 2012-12-10 ENCOUNTER — Other Ambulatory Visit: Payer: Self-pay | Admitting: Gynecology

## 2013-01-24 ENCOUNTER — Ambulatory Visit (INDEPENDENT_AMBULATORY_CARE_PROVIDER_SITE_OTHER): Payer: Medicare Other | Admitting: General Surgery

## 2013-01-24 ENCOUNTER — Encounter (INDEPENDENT_AMBULATORY_CARE_PROVIDER_SITE_OTHER): Payer: Self-pay | Admitting: General Surgery

## 2013-01-24 VITALS — BP 116/70 | HR 76 | Temp 97.1°F | Resp 16 | Ht 63.0 in | Wt 154.0 lb

## 2013-01-24 NOTE — Progress Notes (Signed)
Chief complaint: Followup cancer right breast   History:Patient returns following right breast lumpectomy and sentinel lymph node biopsy for T1 C. N0 ER-positive carcinoma of the right breast now approximately 1 year following diagnosis. She completed radiation therapy and is now on adjuvant tamoxifen. She has no complaints in the office today. Tolerating her tamoxifen well with just some mild hot flashes.No breast lumps or skin changes or nipple discharge or other symptoms of concern.   Exam:  BP 108/68  Pulse 68  Temp 97.2 F (36.2 C) (Temporal)  Resp 12  Ht 5\' 5"  (1.651 m)  Wt 156 lb (70.761 kg)  BMI 25.96 kg/m2  General: Appears well  Lymph nodes: No cervical, subclavicular, or axillary nodes palpable  Extremities: She has full range of motion of her right arm  Breasts: Well-healed lumpectomy site in the upper breast with no thickening. No masses in either breast or skin changes or nipple retraction.   *RADIOLOGY REPORT*  Clinical Data: History of malignant lumpectomy of the right breast  in December 2012. Follow-up evaluation.  DIGITAL DIAGNOSTIC BILATERAL MAMMOGRAM WITH CAD  Comparison: 10/25/2011, 08/30/2011, 08/11/2011, 08/05/2010,  07/30/2009.  Findings: There is a heterogeneously dense parenchymal pattern.  There are mild scarring changes located superiorly within the right  breast in the area of the patient's lumpectomy. There is no  specific evidence for recurrent or residual tumor or developing  malignancy within either breast.  Mammographic images were processed with CAD.  IMPRESSION:  No findings worrisome for recurrent tumor or developing malignancy.  Recommend annual diagnostic mammography.  RECOMMENDATION:  Annual diagnostic mammography.  BI-RADS CATEGORY 1: Negative.     Assessment and plan: Doing well with no evidence of recurrent cancer. She is doing mammogram in September and we will see her in 6 months.

## 2013-01-27 ENCOUNTER — Other Ambulatory Visit: Payer: Self-pay | Admitting: Oncology

## 2013-04-15 ENCOUNTER — Telehealth: Payer: Self-pay | Admitting: Oncology

## 2013-04-15 NOTE — Telephone Encounter (Signed)
, °

## 2013-04-18 ENCOUNTER — Ambulatory Visit: Payer: Medicare Other | Admitting: Oncology

## 2013-04-18 ENCOUNTER — Other Ambulatory Visit: Payer: Medicare Other | Admitting: Lab

## 2013-04-25 ENCOUNTER — Telehealth: Payer: Self-pay | Admitting: Oncology

## 2013-04-26 ENCOUNTER — Telehealth: Payer: Self-pay | Admitting: Oncology

## 2013-05-13 ENCOUNTER — Telehealth: Payer: Self-pay | Admitting: *Deleted

## 2013-05-13 ENCOUNTER — Other Ambulatory Visit (HOSPITAL_BASED_OUTPATIENT_CLINIC_OR_DEPARTMENT_OTHER): Payer: Medicare Other | Admitting: Lab

## 2013-05-13 ENCOUNTER — Ambulatory Visit (HOSPITAL_BASED_OUTPATIENT_CLINIC_OR_DEPARTMENT_OTHER): Payer: Medicare Other | Admitting: Oncology

## 2013-05-13 ENCOUNTER — Encounter: Payer: Self-pay | Admitting: Oncology

## 2013-05-13 VITALS — BP 127/76 | HR 77 | Temp 97.8°F | Resp 20 | Ht 63.0 in | Wt 156.1 lb

## 2013-05-13 DIAGNOSIS — C50219 Malignant neoplasm of upper-inner quadrant of unspecified female breast: Secondary | ICD-10-CM

## 2013-05-13 DIAGNOSIS — M810 Age-related osteoporosis without current pathological fracture: Secondary | ICD-10-CM

## 2013-05-13 DIAGNOSIS — E559 Vitamin D deficiency, unspecified: Secondary | ICD-10-CM

## 2013-05-13 DIAGNOSIS — C50211 Malignant neoplasm of upper-inner quadrant of right female breast: Secondary | ICD-10-CM

## 2013-05-13 LAB — CBC WITH DIFFERENTIAL/PLATELET
EOS%: 1.2 % (ref 0.0–7.0)
Eosinophils Absolute: 0.1 10*3/uL (ref 0.0–0.5)
LYMPH%: 35.8 % (ref 14.0–49.7)
MCH: 30 pg (ref 25.1–34.0)
MCHC: 34.7 g/dL (ref 31.5–36.0)
MCV: 86.5 fL (ref 79.5–101.0)
MONO%: 8.5 % (ref 0.0–14.0)
NEUT#: 2.4 10*3/uL (ref 1.5–6.5)
Platelets: 192 10*3/uL (ref 145–400)
RBC: 4.32 10*6/uL (ref 3.70–5.45)

## 2013-05-13 LAB — COMPREHENSIVE METABOLIC PANEL (CC13)
Alkaline Phosphatase: 87 U/L (ref 40–150)
Glucose: 117 mg/dl (ref 70–140)
Sodium: 140 mEq/L (ref 136–145)
Total Bilirubin: 0.68 mg/dL (ref 0.20–1.20)
Total Protein: 7.4 g/dL (ref 6.4–8.3)

## 2013-05-13 NOTE — Progress Notes (Signed)
OFFICE PROGRESS NOTE  CC  Dr. Glenna Fellows Dr. Elfredia Nevins Dr. Beather Arbour  DIAGNOSIS:  67 year old female with stage I (T1 N0) invasive mammary carcinoma of the right breast diagnosed originally 08/30/2011. Patient is now status post right breast lumpectomy with sentinel node biopsy.   PRIOR THERAPY:  #1 patient is status post right breast lumpectomy performed on 10/25/2011. The final pathology revealed a 1.2 cm invasive ductal carcinoma without lymphovascular invasion.. There was noted to be perineural involvement 3 sentinel nodes were negative for metastatic disease. Tumor was strongly estrogen receptor +100% progesterone receptor +100% HER-2/neu negative proliferation marker 7% pathologic staging T1 C. N0  #2 S/P radiation therapy to the right breast 12/05/11 - 12/30/11  #3 Began Arimidex 1 mg po daily beginning 01/11/12  CURRENT THERAPY: Arimidex 1 mg daily   INTERVAL HISTORY: [name] 67 y.o. female returns forfollowup visit today. Clinically patient seems to be doing well.  She  is denying any headaches double vision blurring of vision. She has no nausea vomiting fevers chills night sweats. No headaches double vision. No myalgias and arthralgias. No vaginal bleeding or discharge. Remainder of the 10 point review of systems is negative.  MEDICAL HISTORY: Past Medical History  Diagnosis Date  . Hyperlipidemia   . Diabetes mellitus   . Breast cancer     INVASIVE MAMMARY DX - 08/30/11  . Arthritis   . GERD (gastroesophageal reflux disease)   . Cough   . S/P radiation therapy 12/05/11 - 12/30/11    Right Breast/Right Breast Boost  . Use of aromatase inhibitors 01/11/12    Arimidex - Dr. Drue Second    ALLERGIES:  is allergic to codeine.  MEDICATIONS:  Current Outpatient Prescriptions  Medication Sig Dispense Refill  . ALPRAZolam (XANAX) 0.5 MG tablet Take 0.5 mg by mouth at bedtime as needed.       Marland Kitchen anastrozole (ARIMIDEX) 1 MG tablet TAKE 1 TABLET (1  MG TOTAL) BY MOUTH DAILY.  90 tablet  4  . aspirin 81 MG tablet Take 81 mg by mouth daily.        Marland Kitchen atorvastatin (LIPITOR) 20 MG tablet Take 20 mg by mouth once.       . gabapentin (NEURONTIN) 300 MG capsule       . metFORMIN (GLUCOPHAGE-XR) 500 MG 24 hr tablet Take 1,000 mg by mouth 2 (two) times daily.       Marland Kitchen venlafaxine (EFFEXOR) 25 MG tablet Take 37.5 mg by mouth once.       Marland Kitchen HYDROcodone-acetaminophen (NORCO) 5-325 MG per tablet Take 1 tablet by mouth every 6 (six) hours as needed.      Marland Kitchen ibuprofen (ADVIL,MOTRIN) 200 MG tablet Take 200 mg by mouth every 6 (six) hours as needed.       No current facility-administered medications for this visit.    SURGICAL HISTORY:  Past Surgical History  Procedure Laterality Date  . Cholecystectomy    . Oophorectomy      with a cyst removal  . Ercp      CBD stone  . Breast biopsy  110/16/12      RIGHT BREAST NEEDLE CORE BIOPSPY, MASS 1 O'CLOCK - INVASIVE MAMARY  . Breast lumpectomy  10/25/11    RIGHT BREAST - INVASIVE DUCTAL, SENTINEL LYMPH BIOPSY,( 0/3) NODES NEGATIVE., ER+, PR+, LOW S-PHASE, HER 2 NEU - NO AMPLIFICATION  . Ovary removed    . Stone in bile duct      REVIEW OF SYSTEMS:  Pertinent  items are noted in HPI.   PHYSICAL EXAMINATION: General appearance: alert, cooperative and appears stated age Head: Normocephalic, without obvious abnormality, atraumatic Neck: no adenopathy, no carotid bruit, no JVD, supple, symmetrical, trachea midline and thyroid not enlarged, symmetric, no tenderness/mass/nodules Lymph nodes: Cervical, supraclavicular, and axillary nodes normal. Resp: clear to auscultation bilaterally and normal percussion bilaterally Back: symmetric, no curvature. ROM normal. No CVA tenderness. Cardio: regular rate and rhythm, S1, S2 normal, no murmur, click, rub or gallop GI: soft, non-tender; bowel sounds normal; no masses,  no organomegaly Extremities: extremities normal, atraumatic, no cyanosis or edema Neurologic: Alert  and oriented X 3, normal strength and tone. Normal symmetric reflexes. Normal coordination and gait Bilateral breasts are examined left breast no masses nipple discharge right breast reveals healing surgical scar there is a little bit of nipple retraction otherwise no other findings. ECOG PERFORMANCE STATUS: 0 - Asymptomatic  Blood pressure 127/76, pulse 77, temperature 97.8 F (36.6 C), temperature source Oral, resp. rate 20, height 5\' 3"  (1.6 m), weight 156 lb 1.6 oz (70.806 kg).  LABORATORY DATA: Lab Results  Component Value Date   WBC 4.4 05/13/2013   HGB 12.9 05/13/2013   HCT 37.3 05/13/2013   MCV 86.5 05/13/2013   PLT 192 05/13/2013      Chemistry      Component Value Date/Time   NA 140 05/13/2013 1142   NA 136 03/15/2012 1041   K 4.9 05/13/2013 1142   K 4.2 03/15/2012 1041   CL 101 10/08/2012 0951   CL 101 03/15/2012 1041   CO2 30* 05/13/2013 1142   CO2 28 03/15/2012 1041   BUN 18.9 05/13/2013 1142   BUN 22 03/15/2012 1041   CREATININE 0.9 05/13/2013 1142   CREATININE 0.90 03/15/2012 1041      Component Value Date/Time   CALCIUM 9.8 05/13/2013 1142   CALCIUM 9.3 03/15/2012 1041   ALKPHOS 87 05/13/2013 1142   ALKPHOS 73 03/15/2012 1041   AST 19 05/13/2013 1142   AST 21 03/15/2012 1041   ALT 19 05/13/2013 1142   ALT 16 03/15/2012 1041   BILITOT 0.68 05/13/2013 1142   BILITOT 1.0 03/15/2012 1041       RADIOGRAPHIC STUDIES: ASSESSMENT: 67 year old female with:  1. stage I invasive ductal carcinoma of the right breast status post lumpectomy with sentinel node biopsy. The final pathology revealing a T1 C. N0 breast cancer. ER positive PR positive HER-2/neu negative with a proliferation marker of 7%.  2. She completed her radiation to the breast and did very well with it.  3. Patient is  on Arimidex 1 mg daily since February 2013. She is tolerating this very well Except for some aches and pains and hot flashes  PLAN:  #1 overall patient is doing remarkably well. She has no evidence of recurrent  disease.  #2 she is tolerating Arimidex very well without any aches or pains.  #3 she is due for a bone density scan in we will have this done prior to her next visit. She is also due for a mammogram in September or October of this year.  #4 she'll be seen back in 6 months time. Prior to the visit she will have a CBC C. matte and vitamin D level drawn  All questions were answered. The patient knows to call the clinic with any problems, questions or concerns. We can certainly see the patient much sooner if necessary.  I spent 25 minutes counseling the patient face to face. The total time spent  in the appointment was 30 minutes.    Drue Second, MD Medical/Oncology [facility] [phone] (beeper) [phone] (Office)  05/13/2013, 12:31 PM

## 2013-05-13 NOTE — Telephone Encounter (Signed)
appts made and printed...td 

## 2013-05-13 NOTE — Patient Instructions (Addendum)
#  1 overall patient is doing remarkably well. She has no evidence of recurrent disease.  #2 she is tolerating Arimidex very well without any aches or pains.  #3 she is due for a bone density scan in we will have this done prior to her next visit. She is also due for a mammogram in September or October of this year.  #4 she'll be seen back in 6 months time. Prior to the visit she will have a CBC C. matte and vitamin D level drawn.

## 2013-07-17 ENCOUNTER — Other Ambulatory Visit (INDEPENDENT_AMBULATORY_CARE_PROVIDER_SITE_OTHER): Payer: Self-pay | Admitting: General Surgery

## 2013-07-17 DIAGNOSIS — Z853 Personal history of malignant neoplasm of breast: Secondary | ICD-10-CM

## 2013-07-17 DIAGNOSIS — Z9889 Other specified postprocedural states: Secondary | ICD-10-CM

## 2013-07-18 ENCOUNTER — Ambulatory Visit (INDEPENDENT_AMBULATORY_CARE_PROVIDER_SITE_OTHER): Payer: Medicare Other | Admitting: General Surgery

## 2013-07-18 ENCOUNTER — Encounter (INDEPENDENT_AMBULATORY_CARE_PROVIDER_SITE_OTHER): Payer: Self-pay | Admitting: General Surgery

## 2013-07-18 VITALS — BP 130/76 | HR 72 | Temp 97.7°F | Resp 14 | Ht 64.0 in | Wt 152.6 lb

## 2013-07-18 DIAGNOSIS — C50211 Malignant neoplasm of upper-inner quadrant of right female breast: Secondary | ICD-10-CM

## 2013-07-18 DIAGNOSIS — C50219 Malignant neoplasm of upper-inner quadrant of unspecified female breast: Secondary | ICD-10-CM

## 2013-07-18 NOTE — Progress Notes (Signed)
Chief complaint: Followup cancer right breast   History:Patient returns following right breast lumpectomy and sentinel lymph node biopsy for T1 C. N0 ER-positive carcinoma of the right breast With diagnosis in December of 2012.   She completed radiation therapy and is now on adjuvant tamoxifen. She has no complaints in the office today. Tolerating her tamoxifen well with just some mild hot flashes.No breast lumps or skin changes or nipple discharge or other symptoms of concern.   Exam:  BP 108/68  Pulse 68  Temp 97.2 F (36.2 C) (Temporal)  Resp 12  Ht 5\' 5"  (1.651 m)  Wt 156 lb (70.761 kg)  BMI 25.96 kg/m2  General: Appears well  Lymph nodes: No cervical, subclavicular, or axillary nodes palpable  Extremities: She has full range of motion of her right arm  Breasts: Well-healed lumpectomy site in the upper breast with no thickening. No masses in either breast or skin changes or nipple retraction.   Mammogram is due next month     Assessment and plan: Doing well with no evidence of recurrent cancer. Return in 6 months.

## 2013-08-15 ENCOUNTER — Other Ambulatory Visit (INDEPENDENT_AMBULATORY_CARE_PROVIDER_SITE_OTHER): Payer: Self-pay | Admitting: General Surgery

## 2013-08-15 ENCOUNTER — Other Ambulatory Visit: Payer: Self-pay | Admitting: Internal Medicine

## 2013-08-15 DIAGNOSIS — Z853 Personal history of malignant neoplasm of breast: Secondary | ICD-10-CM

## 2013-08-15 DIAGNOSIS — Z9889 Other specified postprocedural states: Secondary | ICD-10-CM

## 2013-08-20 ENCOUNTER — Ambulatory Visit
Admission: RE | Admit: 2013-08-20 | Discharge: 2013-08-20 | Disposition: A | Discharge status: Home / Self Care | Payer: Medicare Other | Source: Ambulatory Visit | Attending: General Surgery | Admitting: General Surgery

## 2013-08-20 DIAGNOSIS — Z853 Personal history of malignant neoplasm of breast: Secondary | ICD-10-CM

## 2013-08-20 DIAGNOSIS — Z9889 Other specified postprocedural states: Secondary | ICD-10-CM

## 2013-10-14 ENCOUNTER — Ambulatory Visit
Admission: RE | Admit: 2013-10-14 | Discharge: 2013-10-14 | Disposition: A | Discharge status: Home / Self Care | Payer: Medicare Other | Source: Ambulatory Visit | Attending: Oncology | Admitting: Oncology

## 2013-10-14 DIAGNOSIS — M810 Age-related osteoporosis without current pathological fracture: Secondary | ICD-10-CM

## 2013-10-18 ENCOUNTER — Ambulatory Visit (HOSPITAL_BASED_OUTPATIENT_CLINIC_OR_DEPARTMENT_OTHER): Payer: Medicare Other | Admitting: Oncology

## 2013-10-18 ENCOUNTER — Other Ambulatory Visit (HOSPITAL_BASED_OUTPATIENT_CLINIC_OR_DEPARTMENT_OTHER): Payer: Medicare Other | Admitting: Lab

## 2013-10-18 ENCOUNTER — Other Ambulatory Visit: Payer: Self-pay | Admitting: Oncology

## 2013-10-18 ENCOUNTER — Encounter: Payer: Self-pay | Admitting: Oncology

## 2013-10-18 ENCOUNTER — Telehealth: Payer: Self-pay | Admitting: Oncology

## 2013-10-18 VITALS — BP 126/77 | HR 73 | Temp 98.3°F | Resp 18 | Ht 64.0 in | Wt 157.2 lb

## 2013-10-18 DIAGNOSIS — C50211 Malignant neoplasm of upper-inner quadrant of right female breast: Secondary | ICD-10-CM

## 2013-10-18 DIAGNOSIS — Z170 Estrogen receptor positive status [ER+]: Secondary | ICD-10-CM

## 2013-10-18 DIAGNOSIS — E559 Vitamin D deficiency, unspecified: Secondary | ICD-10-CM

## 2013-10-18 DIAGNOSIS — C50219 Malignant neoplasm of upper-inner quadrant of unspecified female breast: Secondary | ICD-10-CM

## 2013-10-18 LAB — CBC WITH DIFFERENTIAL/PLATELET
Basophils Absolute: 0 10*3/uL (ref 0.0–0.1)
EOS%: 2.4 % (ref 0.0–7.0)
Eosinophils Absolute: 0.1 10*3/uL (ref 0.0–0.5)
HCT: 36.2 % (ref 34.8–46.6)
HGB: 12.1 g/dL (ref 11.6–15.9)
LYMPH%: 33.3 % (ref 14.0–49.7)
MCH: 29.4 pg (ref 25.1–34.0)
MCHC: 33.4 g/dL (ref 31.5–36.0)
MCV: 88.2 fL (ref 79.5–101.0)
MONO#: 0.3 10*3/uL (ref 0.1–0.9)
MONO%: 8.5 % (ref 0.0–14.0)
NEUT#: 1.9 10*3/uL (ref 1.5–6.5)
NEUT%: 54.8 % (ref 38.4–76.8)
Platelets: 191 10*3/uL (ref 145–400)

## 2013-10-18 LAB — COMPREHENSIVE METABOLIC PANEL (CC13)
AST: 18 U/L (ref 5–34)
Albumin: 3.7 g/dL (ref 3.5–5.0)
Alkaline Phosphatase: 102 U/L (ref 40–150)
Anion Gap: 9 mEq/L (ref 3–11)
BUN: 18.2 mg/dL (ref 7.0–26.0)
Creatinine: 0.9 mg/dL (ref 0.6–1.1)
Glucose: 107 mg/dl (ref 70–140)
Potassium: 4.6 mEq/L (ref 3.5–5.1)

## 2013-10-18 MED ORDER — GABAPENTIN 300 MG PO CAPS: 300.0000 mg | ORAL_CAPSULE | Freq: Two times a day (BID) | ORAL | Status: DC

## 2013-10-18 MED ORDER — VENLAFAXINE HCL 25 MG PO TABS: 37.5000 mg | ORAL_TABLET | Freq: Once | ORAL | Status: DC

## 2013-10-18 MED ORDER — ANASTROZOLE 1 MG PO TABS: 1.0000 mg | ORAL_TABLET | Freq: Every day | ORAL | Status: DC

## 2013-10-18 NOTE — Progress Notes (Signed)
OFFICE PROGRESS NOTE  CC  Dr. Glenna Fellows Dr. Elfredia Nevins Dr. Beather Arbour  DIAGNOSIS:  67 year old female with stage I (T1 N0) invasive mammary carcinoma of the right breast diagnosed originally 08/30/2011. Patient is now status post right breast lumpectomy with sentinel node biopsy.   PRIOR THERAPY:  #1 patient is status post right breast lumpectomy performed on 10/25/2011. The final pathology revealed a 1.2 cm invasive ductal carcinoma without lymphovascular invasion.. There was noted to be perineural involvement 3 sentinel nodes were negative for metastatic disease. Tumor was strongly estrogen receptor +100% progesterone receptor +100% HER-2/neu negative proliferation marker 7% pathologic staging T1 C. N0  #2 S/P radiation therapy to the right breast 12/05/11 - 12/30/11  #3 Began Arimidex 1 mg po daily beginning 01/11/12  CURRENT THERAPY: Arimidex 1 mg daily   INTERVAL HISTORY: [name] 67 y.o. female returns for followup visit today. Clinically patient seems to be doing well.  She  is denying any headaches double vision blurring of vision. She has no nausea vomiting fevers chills night sweats. No headaches double vision. No myalgias and arthralgias. No vaginal bleeding or discharge. Remainder of the 10 point review of systems is negative.  MEDICAL HISTORY: Past Medical History  Diagnosis Date  . Hyperlipidemia   . Diabetes mellitus   . Breast cancer     INVASIVE MAMMARY DX - 08/30/11  . Arthritis   . GERD (gastroesophageal reflux disease)   . Cough   . S/P radiation therapy 12/05/11 - 12/30/11    Right Breast/Right Breast Boost  . Use of aromatase inhibitors 01/11/12    Arimidex - Dr. Drue Second    ALLERGIES:  is allergic to codeine.  MEDICATIONS:  Current Outpatient Prescriptions  Medication Sig Dispense Refill  . ALPRAZolam (XANAX) 0.5 MG tablet Take 0.5 mg by mouth at bedtime as needed.       Marland Kitchen anastrozole (ARIMIDEX) 1 MG tablet TAKE 1 TABLET (1  MG TOTAL) BY MOUTH DAILY.  90 tablet  4  . aspirin 81 MG tablet Take 81 mg by mouth daily.        Marland Kitchen atorvastatin (LIPITOR) 20 MG tablet Take 20 mg by mouth once.       . gabapentin (NEURONTIN) 300 MG capsule       . metFORMIN (GLUCOPHAGE-XR) 500 MG 24 hr tablet Take 1,000 mg by mouth 2 (two) times daily.       Marland Kitchen venlafaxine (EFFEXOR) 25 MG tablet Take 37.5 mg by mouth once.       Marland Kitchen HYDROcodone-acetaminophen (NORCO) 5-325 MG per tablet Take 1 tablet by mouth every 6 (six) hours as needed.       No current facility-administered medications for this visit.    SURGICAL HISTORY:  Past Surgical History  Procedure Laterality Date  . Cholecystectomy    . Oophorectomy      with a cyst removal  . Ercp      CBD stone  . Breast biopsy  110/16/12      RIGHT BREAST NEEDLE CORE BIOPSPY, MASS 1 O'CLOCK - INVASIVE MAMARY  . Breast lumpectomy  10/25/11    RIGHT BREAST - INVASIVE DUCTAL, SENTINEL LYMPH BIOPSY,( 0/3) NODES NEGATIVE., ER+, PR+, LOW S-PHASE, HER 2 NEU - NO AMPLIFICATION  . Ovary removed    . Stone in bile duct      REVIEW OF SYSTEMS:  Pertinent items are noted in HPI.   PHYSICAL EXAMINATION: General appearance: alert, cooperative and appears stated age Head: Normocephalic, without obvious  abnormality, atraumatic Neck: no adenopathy, no carotid bruit, no JVD, supple, symmetrical, trachea midline and thyroid not enlarged, symmetric, no tenderness/mass/nodules Lymph nodes: Cervical, supraclavicular, and axillary nodes normal. Resp: clear to auscultation bilaterally and normal percussion bilaterally Back: symmetric, no curvature. ROM normal. No CVA tenderness. Cardio: regular rate and rhythm, S1, S2 normal, no murmur, click, rub or gallop GI: soft, non-tender; bowel sounds normal; no masses,  no organomegaly Extremities: extremities normal, atraumatic, no cyanosis or edema Neurologic: Alert and oriented X 3, normal strength and tone. Normal symmetric reflexes. Normal coordination and  gait Bilateral breasts are examined left breast no masses nipple discharge right breast reveals healing surgical scar there is a little bit of nipple retraction otherwise no other findings. ECOG PERFORMANCE STATUS: 0 - Asymptomatic  Blood pressure 126/77, pulse 73, temperature 98.3 F (36.8 C), temperature source Oral, resp. rate 18, height 5\' 4"  (1.626 m), weight 157 lb 4 oz (71.328 kg).  LABORATORY DATA: Lab Results  Component Value Date   WBC 3.4* 10/18/2013   HGB 12.1 10/18/2013   HCT 36.2 10/18/2013   MCV 88.2 10/18/2013   PLT 191 10/18/2013      Chemistry      Component Value Date/Time   NA 141 10/18/2013 1025   NA 136 03/15/2012 1041   K 4.6 10/18/2013 1025   K 4.2 03/15/2012 1041   CL 101 10/08/2012 0951   CL 101 03/15/2012 1041   CO2 26 10/18/2013 1025   CO2 28 03/15/2012 1041   BUN 18.2 10/18/2013 1025   BUN 22 03/15/2012 1041   CREATININE 0.9 10/18/2013 1025   CREATININE 0.90 03/15/2012 1041      Component Value Date/Time   CALCIUM 9.4 10/18/2013 1025   CALCIUM 9.3 03/15/2012 1041   ALKPHOS 102 10/18/2013 1025   ALKPHOS 73 03/15/2012 1041   AST 18 10/18/2013 1025   AST 21 03/15/2012 1041   ALT 15 10/18/2013 1025   ALT 16 03/15/2012 1041   BILITOT 0.38 10/18/2013 1025   BILITOT 1.0 03/15/2012 1041       RADIOGRAPHIC STUDIES: ASSESSMENT: 67 year old female with:  1. stage I invasive ductal carcinoma of the right breast status post lumpectomy with sentinel node biopsy. The final pathology revealing a T1 C. N0 breast cancer. ER positive PR positive HER-2/neu negative with a proliferation marker of 7%.  2. She completed her radiation to the breast and did very well with it.  3. Patient is  on Arimidex 1 mg daily since February 2013. She is tolerating this very well Except for some aches and pains and hot flashes and   #4 bone density scan showed low bone mass. She will begin taking vitamin D and calcium. We may eventually have to have her on Fosamax.  PLAN:  #1 overall patient is doing  remarkably well. She has no evidence of recurrent disease.  #2 she is tolerating Arimidex very well without any aches or pains.  #3 patient was given a refill for a remedy axilla as well as gabapentin.  #4 she'll be seen back in 6 months time. Prior to the visit she will have a CBC C. matte and vitamin D level   All questions were answered. The patient knows to call the clinic with any problems, questions or concerns. We can certainly see the patient much sooner if necessary.  I spent 25 minutes counseling the patient face to face. The total time spent in the appointment was 30 minutes.    Drue Second, MD Medical/Oncology  [facility] [phone] (beeper) [phone] (Office)  10/18/2013, 11:44 AM

## 2013-10-19 LAB — VITAMIN D 25 HYDROXY (VIT D DEFICIENCY, FRACTURES): Vit D, 25-Hydroxy: 59 ng/mL (ref 30–89)

## 2013-10-20 NOTE — Progress Notes (Signed)
Quick Note:    Please call patient  ______

## 2013-10-23 ENCOUNTER — Telehealth: Payer: Self-pay | Admitting: *Deleted

## 2013-10-23 NOTE — Telephone Encounter (Signed)
Message copied by Cooper Render on Wed Oct 23, 2013  9:54 AM ------      Message from: Victorino December      Created: Sun Oct 20, 2013 11:35 AM       Please call patient: ------

## 2013-10-23 NOTE — Telephone Encounter (Signed)
Notified pt with Lab results

## 2014-03-07 ENCOUNTER — Telehealth: Payer: Self-pay | Admitting: Oncology

## 2014-03-07 NOTE — Telephone Encounter (Signed)
Mailed pts medical records to [facility] pt.

## 2014-03-21 ENCOUNTER — Ambulatory Visit (INDEPENDENT_AMBULATORY_CARE_PROVIDER_SITE_OTHER): Payer: Medicare Other | Admitting: General Surgery

## 2014-04-09 ENCOUNTER — Ambulatory Visit (INDEPENDENT_AMBULATORY_CARE_PROVIDER_SITE_OTHER): Payer: Medicare Other | Admitting: General Surgery

## 2014-04-09 ENCOUNTER — Encounter (INDEPENDENT_AMBULATORY_CARE_PROVIDER_SITE_OTHER): Payer: Self-pay | Admitting: General Surgery

## 2014-04-09 VITALS — BP 128/82 | HR 64 | Temp 97.8°F | Resp 14 | Ht 64.0 in | Wt 154.6 lb

## 2014-04-09 DIAGNOSIS — C50219 Malignant neoplasm of upper-inner quadrant of unspecified female breast: Secondary | ICD-10-CM

## 2014-04-09 NOTE — Progress Notes (Signed)
Chief complaint: Followup cancer right breast   History:Patient returns following right breast lumpectomy and sentinel lymph node biopsy for T1 C. N0 ER-positive carcinoma of the right breast With diagnosis in December of 2012.   She completed radiation therapy and is now on adjuvant tamoxifen. She has no complaints in the office today. Tolerating her tamoxifen well. No breast lumps or skin changes or nipple discharge or other symptoms of concern. She had noted a small bump on the skin under her right axilla but she wanted checked.  Exam:  BP 128/82  Pulse 64  Temp(Src) 97.8 F (36.6 C) (Oral)  Resp 14  Ht 5\' 4"  (1.626 m)  Wt 154 lb 9.6 oz (70.126 kg)  BMI 26.52 kg/m2  General: Appears well  Lymph nodes: No cervical, subclavicular, or axillary nodes palpable. There is a small less than 1 cm noninflamed sebaceous cyst on the skin of the right axilla which is what she had been feeling.  Extremities: She has full range of motion of her right arm  Breasts: Well-healed lumpectomy site in the upper breast with no thickening. No masses in either breast or skin changes or nipple retraction.   Mammogram in October of 2014 was negative     Assessment and plan: Doing well with no evidence of recurrent cancer. Return in 6 months.

## 2014-04-11 ENCOUNTER — Telehealth: Payer: Self-pay | Admitting: Hematology and Oncology

## 2014-04-11 NOTE — Telephone Encounter (Signed)
, °

## 2014-04-24 ENCOUNTER — Ambulatory Visit: Payer: Medicare Other | Admitting: Oncology

## 2014-04-24 ENCOUNTER — Other Ambulatory Visit: Payer: Medicare Other

## 2014-07-08 ENCOUNTER — Other Ambulatory Visit: Payer: Self-pay | Admitting: *Deleted

## 2014-07-08 ENCOUNTER — Telehealth: Payer: Self-pay | Admitting: Hematology and Oncology

## 2014-07-08 DIAGNOSIS — C50219 Malignant neoplasm of upper-inner quadrant of unspecified female breast: Secondary | ICD-10-CM

## 2014-07-08 NOTE — Telephone Encounter (Signed)
cld & left message for pt of the time & date of appt-have attempted to call pt several times-loud buzz on the other end. Today it goes to a ring

## 2014-07-09 ENCOUNTER — Ambulatory Visit (HOSPITAL_BASED_OUTPATIENT_CLINIC_OR_DEPARTMENT_OTHER): Payer: Medicare Other | Admitting: Hematology and Oncology

## 2014-07-09 ENCOUNTER — Encounter: Payer: Self-pay | Admitting: Hematology and Oncology

## 2014-07-09 ENCOUNTER — Other Ambulatory Visit (HOSPITAL_BASED_OUTPATIENT_CLINIC_OR_DEPARTMENT_OTHER): Payer: Medicare Other

## 2014-07-09 VITALS — BP 124/60 | HR 70 | Temp 98.5°F | Wt 155.5 lb

## 2014-07-09 DIAGNOSIS — M899 Disorder of bone, unspecified: Secondary | ICD-10-CM

## 2014-07-09 DIAGNOSIS — C50219 Malignant neoplasm of upper-inner quadrant of unspecified female breast: Secondary | ICD-10-CM

## 2014-07-09 DIAGNOSIS — Z170 Estrogen receptor positive status [ER+]: Secondary | ICD-10-CM

## 2014-07-09 DIAGNOSIS — M949 Disorder of cartilage, unspecified: Secondary | ICD-10-CM

## 2014-07-09 LAB — COMPREHENSIVE METABOLIC PANEL (CC13)
ALT: 15 U/L (ref 0–55)
AST: 18 U/L (ref 5–34)
Albumin: 3.5 g/dL (ref 3.5–5.0)
Alkaline Phosphatase: 86 U/L (ref 40–150)
Anion Gap: 9 mEq/L (ref 3–11)
BILIRUBIN TOTAL: 0.49 mg/dL (ref 0.20–1.20)
BUN: 16.5 mg/dL (ref 7.0–26.0)
CALCIUM: 9.1 mg/dL (ref 8.4–10.4)
CHLORIDE: 107 meq/L (ref 98–109)
CO2: 26 mEq/L (ref 22–29)
CREATININE: 0.8 mg/dL (ref 0.6–1.1)
Glucose: 126 mg/dl (ref 70–140)
Potassium: 3.8 mEq/L (ref 3.5–5.1)
Sodium: 141 mEq/L (ref 136–145)
Total Protein: 6.5 g/dL (ref 6.4–8.3)

## 2014-07-09 LAB — CBC WITH DIFFERENTIAL/PLATELET
BASO%: 1 % (ref 0.0–2.0)
Basophils Absolute: 0 10*3/uL (ref 0.0–0.1)
EOS%: 1.5 % (ref 0.0–7.0)
Eosinophils Absolute: 0.1 10*3/uL (ref 0.0–0.5)
HEMATOCRIT: 34.3 % — AB (ref 34.8–46.6)
HGB: 11.3 g/dL — ABNORMAL LOW (ref 11.6–15.9)
LYMPH#: 1.4 10*3/uL (ref 0.9–3.3)
LYMPH%: 31.1 % (ref 14.0–49.7)
MCH: 29 pg (ref 25.1–34.0)
MCHC: 32.8 g/dL (ref 31.5–36.0)
MCV: 88.3 fL (ref 79.5–101.0)
MONO#: 0.4 10*3/uL (ref 0.1–0.9)
MONO%: 8.9 % (ref 0.0–14.0)
NEUT#: 2.7 10*3/uL (ref 1.5–6.5)
NEUT%: 57.5 % (ref 38.4–76.8)
Platelets: 201 10*3/uL (ref 145–400)
RBC: 3.89 10*6/uL (ref 3.70–5.45)
RDW: 13.7 % (ref 11.2–14.5)
WBC: 4.6 10*3/uL (ref 3.9–10.3)

## 2014-07-09 NOTE — Assessment & Plan Note (Signed)
1. Right breast invasive ductal carcinoma T1 C. N0 M0 ER/PR positive HER-2 negative status post lumpectomy and radiation in December 2012: Patient is here for routine followup I performed a breast examination and did not feel any lumps or nodules. Patient is due for mammograms in October. She will continue her adjuvant anastrozole therapy as she is tolerating it very well without any major problems.  2.Survivorship: I encouraged her to take calcium and vitamin D but the patient refuses she does not want to take too many pills. I instructed her to drink 2 glasses of milk and spent at least an hour outdoors using some sunscreen to get vitamin D. I also encouraged her to walk outside and get some exercise. I encouraged her to increase her intake of fruits and vegetables and decrease red meat.  3. Osteopenia: Bone density test done in 2014 revealed osteopenia with a T score of -2.4. I would like to repeat this test and determine if she needs to be on bisphosphonate therapy. Patient does not want to take calcium and vitamin D but will drink milk and get some sun exposure.

## 2014-07-09 NOTE — Progress Notes (Signed)
Patient Care Team: Redmond School, MD as PCP - General (Internal Medicine) Vanessa Orion, MD (Obstetrics and Gynecology) Deatra Robinson, MD (Internal Medicine) Eppie Gibson, MD (Radiation Oncology)  DIAGNOSIS: Cancer of upper-inner quadrant of female breast   Primary site: Breast (Right)   Staging method: AJCC 7th Edition   Pathologic: Stage IA (T1, N0, cM0)   Summary: Stage IA (T1, N0, cM0)   Prognostic indicators: ER PR Pos   SUMMARY OF ONCOLOGIC HISTORY:   Cancer of upper-inner quadrant of female breast   09/07/2011 Initial Diagnosis Cancer of upper-inner quadrant of female breast   10/25/2011 Surgery Right breast lumpectomy; 1.2 cm IDC No LVI; 3 SLN Neg; Er 100%, PR 100%; Her 2 Neg; Ki 67: 7%, T1CN0Mo (stage IA)   12/05/2011 - 12/30/2011 Radiation Therapy Radiation to lumpectomy site   01/11/2012 -  Anti-estrogen oral therapy Arimidex 1 mg daily    CHIEF COMPLIANT: Followup of history of breast cancer  INTERVAL HISTORY: [name] is a 68 year old Caucasian lady with above-mentioned history of right breast invasive ductal carcinoma she underwent lumpectomy and radiation therapy and has been on Arimidex therapy since February 2013. She has tolerated this treatment fairly well without any major problems. She does get occasional hot flashes. She denies any vaginal bleeding. She was noted to have severe osteopenia but she has not been on any medication for it. She was prescribed calcium and vitamin D but she does for her take that either. Denies any other major problems or concerns.   REVIEW OF SYSTEMS:   Constitutional: Denies fevers, chills or abnormal weight loss Eyes: Denies blurriness of vision Ears, nose, mouth, throat, and face: Denies mucositis or sore throat Respiratory: Denies cough, dyspnea or wheezes Cardiovascular: Denies palpitation, chest discomfort or lower extremity swelling Gastrointestinal:  Denies nausea, heartburn or change in bowel habits Skin: Denies  abnormal skin rashes Lymphatics: Denies new lymphadenopathy or easy bruising Neurological:Denies numbness, tingling or new weaknesses Behavioral/Psych: Mood is stable, no new changes  Breast: denies any pain or lumps or nodules in either breasts All other systems were reviewed with the patient and are negative.  I have reviewed the past medical history, past surgical history, social history and family history with the patient and they are unchanged from previous note.  ALLERGIES:  is allergic to codeine.  MEDICATIONS:  Current Outpatient Prescriptions  Medication Sig Dispense Refill  . ALPRAZolam (XANAX) 0.5 MG tablet Take 0.5 mg by mouth at bedtime as needed.       Marland Kitchen anastrozole (ARIMIDEX) 1 MG tablet Take 1 tablet (1 mg total) by mouth daily.  90 tablet  12  . aspirin 81 MG tablet Take 81 mg by mouth daily.        Marland Kitchen atorvastatin (LIPITOR) 20 MG tablet Take 20 mg by mouth once.       . gabapentin (NEURONTIN) 300 MG capsule Take 1 capsule (300 mg total) by mouth 2 (two) times daily.  60 capsule  5  . HYDROcodone-acetaminophen (NORCO) 5-325 MG per tablet Take 1 tablet by mouth every 6 (six) hours as needed.      . metFORMIN (GLUCOPHAGE-XR) 500 MG 24 hr tablet Take 1,000 mg by mouth 2 (two) times daily.       Marland Kitchen venlafaxine (EFFEXOR) 25 MG tablet Take 1.5 tablets (37.5 mg total) by mouth once.  30 tablet  6   No current facility-administered medications for this visit.    PHYSICAL EXAMINATION: ECOG PERFORMANCE STATUS: 0 - Asymptomatic  Filed Vitals:  07/09/14 1558  BP: 124/60  Pulse: 70  Temp: 98.5 F (36.9 C)   Filed Weights   07/09/14 1558  Weight: 155 lb 8 oz (70.534 kg)    GENERAL:alert, no distress and comfortable SKIN: skin color, texture, turgor are normal, no rashes or significant lesions EYES: normal, Conjunctiva are pink and non-injected, sclera clear OROPHARYNX:no exudate, no erythema and lips, buccal mucosa, and tongue normal  NECK: supple, thyroid normal size,  non-tender, without nodularity LYMPH:  no palpable lymphadenopathy in the cervical, axillary or inguinal LUNGS: clear to auscultation and percussion with normal breathing effort HEART: regular rate & rhythm and no murmurs and no lower extremity edema ABDOMEN:abdomen soft, non-tender and normal bowel sounds Musculoskeletal:no cyanosis of digits and no clubbing  NEURO: alert & oriented x 3 with fluent speech, no focal motor/sensory deficits BREAST:No palpable masses lungs or nodules in either right or left breasts. No palpable axillary supraclavicular or infraclavicular adenopathy no breast tenderness or nipple discharge.   LABORATORY DATA:  I have reviewed the data as listed Appointment on 07/09/2014  Component Date Value Ref Range Status  . WBC 07/09/2014 4.6  3.9 - 10.3 10e3/uL Final  . NEUT# 07/09/2014 2.7  1.5 - 6.5 10e3/uL Final  . HGB 07/09/2014 11.3* 11.6 - 15.9 g/dL Final  . HCT 07/09/2014 34.3* 34.8 - 46.6 % Final  . Platelets 07/09/2014 201  145 - 400 10e3/uL Final  . MCV 07/09/2014 88.3  79.5 - 101.0 fL Final  . MCH 07/09/2014 29.0  25.1 - 34.0 pg Final  . MCHC 07/09/2014 32.8  31.5 - 36.0 g/dL Final  . RBC 07/09/2014 3.89  3.70 - 5.45 10e6/uL Final  . RDW 07/09/2014 13.7  11.2 - 14.5 % Final  . lymph# 07/09/2014 1.4  0.9 - 3.3 10e3/uL Final  . MONO# 07/09/2014 0.4  0.1 - 0.9 10e3/uL Final  . Eosinophils Absolute 07/09/2014 0.1  0.0 - 0.5 10e3/uL Final  . Basophils Absolute 07/09/2014 0.0  0.0 - 0.1 10e3/uL Final  . NEUT% 07/09/2014 57.5  38.4 - 76.8 % Final  . LYMPH% 07/09/2014 31.1  14.0 - 49.7 % Final  . MONO% 07/09/2014 8.9  0.0 - 14.0 % Final  . EOS% 07/09/2014 1.5  0.0 - 7.0 % Final  . BASO% 07/09/2014 1.0  0.0 - 2.0 % Final  . Sodium 07/09/2014 141  136 - 145 mEq/L Final  . Potassium 07/09/2014 3.8  3.5 - 5.1 mEq/L Final  . Chloride 07/09/2014 107  98 - 109 mEq/L Final  . CO2 07/09/2014 26  22 - 29 mEq/L Final  . Glucose 07/09/2014 126  70 - 140 mg/dl Final  .  BUN 07/09/2014 16.5  7.0 - 26.0 mg/dL Final  . Creatinine 07/09/2014 0.8  0.6 - 1.1 mg/dL Final  . Total Bilirubin 07/09/2014 0.49  0.20 - 1.20 mg/dL Final  . Alkaline Phosphatase 07/09/2014 86  40 - 150 U/L Final  . AST 07/09/2014 18  5 - 34 U/L Final  . ALT 07/09/2014 15  0 - 55 U/L Final  . Total Protein 07/09/2014 6.5  6.4 - 8.3 g/dL Final  . Albumin 07/09/2014 3.5  3.5 - 5.0 g/dL Final  . Calcium 07/09/2014 9.1  8.4 - 10.4 mg/dL Final  . Anion Gap 07/09/2014 9  3 - 11 mEq/L Final    RADIOGRAPHIC STUDIES: I have personally reviewed the radiology reports and agreed with their findings. No results found.   ASSESSMENT & PLAN:  Cancer of upper-inner quadrant of female  breast 1. Right breast invasive ductal carcinoma T1 C. N0 M0 ER/PR positive HER-2 negative status post lumpectomy and radiation in December 2012: Patient is here for routine followup I performed a breast examination and did not feel any lumps or nodules. Patient is due for mammograms in October. She will continue her adjuvant anastrozole therapy as she is tolerating it very well without any major problems.  2.Survivorship: I encouraged her to take calcium and vitamin D but the patient refuses she does not want to take too many pills. I instructed her to drink 2 glasses of milk and spent at least an hour outdoors using some sunscreen to get vitamin D. I also encouraged her to walk outside and get some exercise. I encouraged her to increase her intake of fruits and vegetables and decrease red meat.  3. severe Osteopenia: Bone density test done in 2014 revealed osteopenia with a T score of -2.4. I would like to repeat this test and determine if she needs to be on bisphosphonate therapy. Patient does not want to take calcium and vitamin D but will drink milk and get some sun exposure.   Orders Placed This Encounter  Procedures  . MM Digital Diagnostic Bilat    Standing Status: Future     Number of Occurrences:      Standing  Expiration Date: 07/09/2015    Order Specific Question:  Reason for Exam (SYMPTOM  OR DIAGNOSIS REQUIRED)    Answer:  H/O breast cancer annual followup    Order Specific Question:  Preferred imaging location?    Answer:  [facility]  . DG Bone Density    Standing Status: Future     Number of Occurrences:      Standing Expiration Date: 07/09/2015    Order Specific Question:  Reason for Exam (SYMPTOM  OR DIAGNOSIS REQUIRED)    Answer:  Aromatase inhibitor therapy previous osteopenia    Order Specific Question:  Preferred imaging location?    Answer:  [facility]   The patient has a good understanding of the overall plan. she agrees with it. She will call with any problems that may develop before her next visit here.  I spent 25 minutes counseling the patient face to face. The total time spent in the appointment was 30 minutes and more than 50% was on counseling and review of test results    Rulon Eisenmenger, MD 07/09/2014 4:28 PM

## 2014-07-10 ENCOUNTER — Telehealth: Payer: Self-pay | Admitting: Hematology and Oncology

## 2014-07-10 NOTE — Telephone Encounter (Signed)
, °

## 2014-07-25 ENCOUNTER — Other Ambulatory Visit: Payer: Self-pay | Admitting: Oncology

## 2014-07-29 ENCOUNTER — Other Ambulatory Visit: Payer: Self-pay

## 2014-08-21 ENCOUNTER — Encounter (INDEPENDENT_AMBULATORY_CARE_PROVIDER_SITE_OTHER): Payer: Self-pay

## 2014-08-21 ENCOUNTER — Ambulatory Visit
Admission: RE | Admit: 2014-08-21 | Discharge: 2014-08-21 | Disposition: A | Discharge status: Home / Self Care | Payer: Medicare Other | Source: Ambulatory Visit | Attending: Hematology and Oncology | Admitting: Hematology and Oncology

## 2014-08-21 DIAGNOSIS — C50219 Malignant neoplasm of upper-inner quadrant of unspecified female breast: Secondary | ICD-10-CM

## 2014-09-15 ENCOUNTER — Ambulatory Visit (HOSPITAL_BASED_OUTPATIENT_CLINIC_OR_DEPARTMENT_OTHER): Payer: Medicare Other | Admitting: Hematology and Oncology

## 2014-09-15 ENCOUNTER — Telehealth: Payer: Self-pay | Admitting: Hematology and Oncology

## 2014-09-15 DIAGNOSIS — C50211 Malignant neoplasm of upper-inner quadrant of right female breast: Secondary | ICD-10-CM

## 2014-09-15 DIAGNOSIS — C50219 Malignant neoplasm of upper-inner quadrant of unspecified female breast: Secondary | ICD-10-CM

## 2014-09-15 DIAGNOSIS — M949 Disorder of cartilage, unspecified: Secondary | ICD-10-CM

## 2014-09-15 DIAGNOSIS — M899 Disorder of bone, unspecified: Secondary | ICD-10-CM

## 2014-09-15 DIAGNOSIS — Z170 Estrogen receptor positive status [ER+]: Secondary | ICD-10-CM

## 2014-09-15 NOTE — Progress Notes (Signed)
Patient Care Team: Redmond School, MD as PCP - General (Internal Medicine) Selinda Orion, MD (Obstetrics and Gynecology) Deatra Robinson, MD (Internal Medicine) Eppie Gibson, MD (Radiation Oncology)  DIAGNOSIS: Cancer of upper-inner quadrant of female breast   Primary site: Breast (Right)   Staging method: AJCC 7th Edition   Pathologic: Stage IA (T1, N0, cM0) - Unsigned   Summary: Stage IA (T1, N0, cM0)   Prognostic indicators: ER PR Pos   SUMMARY OF ONCOLOGIC HISTORY:   Cancer of upper-inner quadrant of female breast   09/07/2011 Initial Diagnosis Cancer of upper-inner quadrant of female breast   10/25/2011 Surgery Right breast lumpectomy; 1.2 cm IDC No LVI; 3 SLN Neg; Er 100%, PR 100%; Her 2 Neg; Ki 67: 7%, T1CN0Mo (stage IA)   12/05/2011 - 12/30/2011 Radiation Therapy Radiation to lumpectomy site   01/11/2012 -  Anti-estrogen oral therapy Arimidex 1 mg daily    CHIEF COMPLIANT: followup of breast cancer  INTERVAL HISTORY: [name] is a 68 year old Caucasian with above-mentioned history of right breast cancer treated with lumpectomy radiation and has been on hormonal therapy with Arimidex since February 2013. Is tolerating it extremely well without any major problems or concerns. Gen. Recent mammogram is here today to discuss the results.she reports no new problems or concerns.  REVIEW OF SYSTEMS:   Constitutional: Denies fevers, chills or abnormal weight loss Eyes: Denies blurriness of vision Ears, nose, mouth, throat, and face: Denies mucositis or sore throat Respiratory: Denies cough, dyspnea or wheezes Cardiovascular: Denies palpitation, chest discomfort or lower extremity swelling Gastrointestinal:  Denies nausea, heartburn or change in bowel habits Skin: Denies abnormal skin rashes Lymphatics: Denies new lymphadenopathy or easy bruising Neurological:Denies numbness, tingling or new weaknesses Behavioral/Psych: Mood is stable, no new changes  Breast:  denies any  pain or lumps or nodules in either breasts All other systems were reviewed with the patient and are negative.  I have reviewed the past medical history, past surgical history, social history and family history with the patient and they are unchanged from previous note.  ALLERGIES:  is allergic to codeine.  MEDICATIONS:  Current Outpatient Prescriptions  Medication Sig Dispense Refill  . nabumetone (RELAFEN) 750 MG tablet Take 750 mg by mouth 2 (two) times daily.    Marland Kitchen ALPRAZolam (XANAX) 0.5 MG tablet Take 0.5 mg by mouth at bedtime as needed.     Marland Kitchen anastrozole (ARIMIDEX) 1 MG tablet Take 1 tablet (1 mg total) by mouth daily. 90 tablet 12  . aspirin 81 MG tablet Take 81 mg by mouth daily.      Marland Kitchen atorvastatin (LIPITOR) 20 MG tablet Take 20 mg by mouth once.     . gabapentin (NEURONTIN) 300 MG capsule Take 1 capsule (300 mg total) by mouth 2 (two) times daily. 60 capsule 5  . HYDROcodone-acetaminophen (NORCO) 5-325 MG per tablet Take 1 tablet by mouth every 6 (six) hours as needed.    . metFORMIN (GLUCOPHAGE-XR) 500 MG 24 hr tablet Take 1,000 mg by mouth 2 (two) times daily.     Marland Kitchen venlafaxine (EFFEXOR) 25 MG tablet Take 1.5 tablets (37.5 mg total) by mouth daily. 45 tablet 2   No current facility-administered medications for this visit.    PHYSICAL EXAMINATION: ECOG PERFORMANCE STATUS: 0 - Asymptomatic  Filed Vitals:   09/15/14 1158  BP: 133/76  Pulse: 70  Temp: 98.4 F (36.9 C)  Resp: 18   Filed Weights   09/15/14 1158  Weight: 152 lb 1.6 oz (68.992  kg)    GENERAL:alert, no distress and comfortable SKIN: skin color, texture, turgor are normal, no rashes or significant lesions EYES: normal, Conjunctiva are pink and non-injected, sclera clear OROPHARYNX:no exudate, no erythema and lips, buccal mucosa, and tongue normal  NECK: supple, thyroid normal size, non-tender, without nodularity LYMPH:  no palpable lymphadenopathy in the cervical, axillary or inguinal LUNGS: clear to  auscultation and percussion with normal breathing effort HEART: regular rate & rhythm and no murmurs and no lower extremity edema ABDOMEN:abdomen soft, non-tender and normal bowel sounds Musculoskeletal:no cyanosis of digits and no clubbing  NEURO: alert & oriented x 3 with fluent speech, no focal motor/sensory deficits  LABORATORY DATA:  I have reviewed the data as listed   Chemistry      Component Value Date/Time   NA 141 07/09/2014 1529   NA 136 03/15/2012 1041   K 3.8 07/09/2014 1529   K 4.2 03/15/2012 1041   CL 101 10/08/2012 0951   CL 101 03/15/2012 1041   CO2 26 07/09/2014 1529   CO2 28 03/15/2012 1041   BUN 16.5 07/09/2014 1529   BUN 22 03/15/2012 1041   CREATININE 0.8 07/09/2014 1529   CREATININE 0.90 03/15/2012 1041      Component Value Date/Time   CALCIUM 9.1 07/09/2014 1529   CALCIUM 9.3 03/15/2012 1041   ALKPHOS 86 07/09/2014 1529   ALKPHOS 73 03/15/2012 1041   AST 18 07/09/2014 1529   AST 21 03/15/2012 1041   ALT 15 07/09/2014 1529   ALT 16 03/15/2012 1041   BILITOT 0.49 07/09/2014 1529   BILITOT 1.0 03/15/2012 1041       Lab Results  Component Value Date   WBC 4.6 07/09/2014   HGB 11.3* 07/09/2014   HCT 34.3* 07/09/2014   MCV 88.3 07/09/2014   PLT 201 07/09/2014   NEUTROABS 2.7 07/09/2014     RADIOGRAPHIC STUDIES: I have personally reviewed the radiology reports and agreed with their findings. No results found.   ASSESSMENT & PLAN:  Cancer of upper-inner quadrant of female breast Right breast invasive ductal carcinoma T1 C. N0 M0 ER/PR positive HER-2 negative status post lumpectomy and radiation in December 2012, currently on anastrozole since February 2013.  Surveillance: I reviewed the mammograms that were done in October. The mammograms were normal. Recent physical exam by myself was also normal. We will continue annual mammograms and physical exams every 6 months. Return to clinic in 6 months with labs and follow up.  Osteopenia: Patient  will need a bone density test in 2016. She is currently on calcium and vitamin D.   Orders Placed This Encounter  Procedures  . CBC with Differential    Standing Status: Future     Number of Occurrences:      Standing Expiration Date: 09/15/2015  . Comprehensive metabolic panel (Cmet) - [facility]    Standing Status: Future     Number of Occurrences:      Standing Expiration Date: 09/15/2015   The patient has a good understanding of the overall plan. she agrees with it. She will call with any problems that may develop before her next visit here.  I spent 15 minutes counseling the patient face to face. The total time spent in the appointment was 15 minutes and more than 50% was on counseling and review of test results    Rulon Eisenmenger, MD 09/15/2014 12:39 PM

## 2014-09-15 NOTE — Assessment & Plan Note (Signed)
Right breast invasive ductal carcinoma T1 C. N0 M0 ER/PR positive HER-2 negative status post lumpectomy and radiation in December 2012, currently on anastrozole since February 2013.  Surveillance: I reviewed the mammograms that were done in October. The mammograms were normal. Recent physical exam by myself was also normal. We will continue annual mammograms and physical exams every 6 months. Return to clinic in 6 months with labs and follow up.  Osteopenia: Patient will need a bone density test in 2016. She is currently on calcium and vitamin D.

## 2014-09-15 NOTE — Telephone Encounter (Signed)
per pof to sch pt appt-gave pt copy of sch °

## 2014-10-21 ENCOUNTER — Other Ambulatory Visit: Payer: Self-pay | Admitting: Hematology and Oncology

## 2014-10-23 ENCOUNTER — Other Ambulatory Visit: Payer: Self-pay | Admitting: *Deleted

## 2014-10-23 DIAGNOSIS — C50219 Malignant neoplasm of upper-inner quadrant of unspecified female breast: Secondary | ICD-10-CM

## 2014-10-23 MED ORDER — VENLAFAXINE HCL 25 MG PO TABS: 37.5000 mg | ORAL_TABLET | Freq: Every day | ORAL | Status: DC

## 2014-11-13 ENCOUNTER — Other Ambulatory Visit: Payer: Self-pay | Admitting: Oncology

## 2015-01-13 DIAGNOSIS — I10 Essential (primary) hypertension: Secondary | ICD-10-CM | POA: Diagnosis not present

## 2015-01-13 DIAGNOSIS — E119 Type 2 diabetes mellitus without complications: Secondary | ICD-10-CM | POA: Diagnosis not present

## 2015-01-13 DIAGNOSIS — G629 Polyneuropathy, unspecified: Secondary | ICD-10-CM | POA: Diagnosis not present

## 2015-01-13 DIAGNOSIS — Z6825 Body mass index (BMI) 25.0-25.9, adult: Secondary | ICD-10-CM | POA: Diagnosis not present

## 2015-01-23 ENCOUNTER — Other Ambulatory Visit: Payer: Self-pay | Admitting: Hematology and Oncology

## 2015-01-29 DIAGNOSIS — M722 Plantar fascial fibromatosis: Secondary | ICD-10-CM | POA: Diagnosis not present

## 2015-01-29 DIAGNOSIS — M79671 Pain in right foot: Secondary | ICD-10-CM | POA: Diagnosis not present

## 2015-02-12 DIAGNOSIS — C50212 Malignant neoplasm of upper-inner quadrant of left female breast: Secondary | ICD-10-CM | POA: Diagnosis not present

## 2015-02-12 DIAGNOSIS — C50211 Malignant neoplasm of upper-inner quadrant of right female breast: Secondary | ICD-10-CM | POA: Diagnosis not present

## 2015-02-18 DIAGNOSIS — M722 Plantar fascial fibromatosis: Secondary | ICD-10-CM | POA: Diagnosis not present

## 2015-02-18 DIAGNOSIS — M79671 Pain in right foot: Secondary | ICD-10-CM | POA: Diagnosis not present

## 2015-03-18 NOTE — Assessment & Plan Note (Signed)
Right breast invasive ductal carcinoma T1 C. N0 M0 ER/PR positive HER-2 negative status post lumpectomy and radiation in December 2012, currently on anastrozole since February 2013.  Anastrozole Toxicities: No major side effects  Breast Cancer Surveillance: 1. Breast exam 03/19/15: Normal 2. Mammogram 08/21/14 No abnormalities. Postsurgical changes. Breast Density Category C. I recommended that she get 3-D mammograms for surveillance. Discussed the differences between different breast density categories.   Osteopenia: Patient will need a bone density test in 2016. She is currently on calcium and vitamin D.

## 2015-03-19 ENCOUNTER — Ambulatory Visit (HOSPITAL_BASED_OUTPATIENT_CLINIC_OR_DEPARTMENT_OTHER): Payer: Medicare Other | Admitting: Hematology and Oncology

## 2015-03-19 ENCOUNTER — Telehealth: Payer: Self-pay | Admitting: Hematology and Oncology

## 2015-03-19 ENCOUNTER — Other Ambulatory Visit (HOSPITAL_BASED_OUTPATIENT_CLINIC_OR_DEPARTMENT_OTHER): Payer: Medicare Other

## 2015-03-19 ENCOUNTER — Other Ambulatory Visit: Payer: Self-pay | Admitting: Hematology and Oncology

## 2015-03-19 VITALS — BP 132/76 | HR 76 | Temp 97.9°F | Resp 18 | Ht 64.0 in | Wt 155.3 lb

## 2015-03-19 DIAGNOSIS — C50219 Malignant neoplasm of upper-inner quadrant of unspecified female breast: Secondary | ICD-10-CM

## 2015-03-19 DIAGNOSIS — C50211 Malignant neoplasm of upper-inner quadrant of right female breast: Secondary | ICD-10-CM

## 2015-03-19 DIAGNOSIS — M8580 Other specified disorders of bone density and structure, unspecified site: Secondary | ICD-10-CM

## 2015-03-19 DIAGNOSIS — M810 Age-related osteoporosis without current pathological fracture: Secondary | ICD-10-CM

## 2015-03-19 DIAGNOSIS — Z853 Personal history of malignant neoplasm of breast: Secondary | ICD-10-CM

## 2015-03-19 LAB — COMPREHENSIVE METABOLIC PANEL (CC13)
ALT: 14 U/L (ref 0–55)
ANION GAP: 11 meq/L (ref 3–11)
AST: 16 U/L (ref 5–34)
Albumin: 3.7 g/dL (ref 3.5–5.0)
Alkaline Phosphatase: 80 U/L (ref 40–150)
BUN: 20.1 mg/dL (ref 7.0–26.0)
CALCIUM: 9.2 mg/dL (ref 8.4–10.4)
CHLORIDE: 105 meq/L (ref 98–109)
CO2: 26 meq/L (ref 22–29)
CREATININE: 0.8 mg/dL (ref 0.6–1.1)
EGFR: 71 mL/min/{1.73_m2} — AB (ref >90)
GLUCOSE: 99 mg/dL (ref 70–140)
Potassium: 4.3 mEq/L (ref 3.5–5.1)
Sodium: 141 mEq/L (ref 136–145)
Total Bilirubin: 0.53 mg/dL (ref 0.20–1.20)
Total Protein: 6.7 g/dL (ref 6.4–8.3)

## 2015-03-19 LAB — CBC WITH DIFFERENTIAL/PLATELET
BASO%: 0.2 % (ref 0.0–2.0)
Basophils Absolute: 0 10*3/uL (ref 0.0–0.1)
EOS ABS: 0.1 10*3/uL (ref 0.0–0.5)
EOS%: 2.4 % (ref 0.0–7.0)
HEMATOCRIT: 37.7 % (ref 34.8–46.6)
HEMOGLOBIN: 12.6 g/dL (ref 11.6–15.9)
LYMPH%: 36.1 % (ref 14.0–49.7)
MCH: 29.4 pg (ref 25.1–34.0)
MCHC: 33.4 g/dL (ref 31.5–36.0)
MCV: 88.1 fL (ref 79.5–101.0)
MONO#: 0.4 10*3/uL (ref 0.1–0.9)
MONO%: 9.5 % (ref 0.0–14.0)
NEUT#: 2.2 10*3/uL (ref 1.5–6.5)
NEUT%: 51.8 % (ref 38.4–76.8)
Platelets: 189 10*3/uL (ref 145–400)
RBC: 4.28 10*6/uL (ref 3.70–5.45)
RDW: 13.4 % (ref 11.2–14.5)
WBC: 4.2 10*3/uL (ref 3.9–10.3)
lymph#: 1.5 10*3/uL (ref 0.9–3.3)

## 2015-03-19 NOTE — Telephone Encounter (Signed)
Appointments made and avs printed for patient °

## 2015-03-19 NOTE — Progress Notes (Signed)
Patient Care Team: Redmond School, MD as PCP - General (Internal Medicine) Selinda Orion, MD (Obstetrics and Gynecology) Consuela Mimes, MD (Internal Medicine) Eppie Gibson, MD (Radiation Oncology)  DIAGNOSIS: Breast cancer of upper-inner quadrant of right female breast   Staging form: Breast, AJCC 7th Edition     Clinical: No stage assigned - Unsigned       Prognostic indicators: ER PR Pos      Pathologic: Stage IA (T1, N0, cM0) - Unsigned       Prognostic indicators: ER PR Pos    SUMMARY OF ONCOLOGIC HISTORY:   Breast cancer of upper-inner quadrant of right female breast   09/07/2011 Initial Diagnosis Cancer of upper-inner quadrant of female breast   10/25/2011 Surgery Right breast lumpectomy; 1.2 cm IDC No LVI; 3 SLN Neg; Er 100%, PR 100%; Her 2 Neg; Ki 67: 7%, T1CN0Mo (stage IA)   12/05/2011 - 12/30/2011 Radiation Therapy Radiation to lumpectomy site   01/11/2012 -  Anti-estrogen oral therapy Arimidex 1 mg daily    CHIEF COMPLIANT: Follow-up for breast cancer on anastrozole  INTERVAL HISTORY: [name] is a 69 year old with above-mentioned history of right breast cancer treated with lumpectomy and radiation and is currently on anastrozole 1 mg daily. Is tolerating it fairly well. Recently she has felt more fatigued than normal. She denies any lumps or nodules in the breasts.  REVIEW OF SYSTEMS:   Constitutional: Denies fevers, chills or abnormal weight loss Eyes: Denies blurriness of vision Ears, nose, mouth, throat, and face: Denies mucositis or sore throat Respiratory: Denies cough, dyspnea or wheezes Cardiovascular: Denies palpitation, chest discomfort or lower extremity swelling Gastrointestinal:  Denies nausea, heartburn or change in bowel habits Skin: Denies abnormal skin rashes Lymphatics: Denies new lymphadenopathy or easy bruising Neurological:Denies numbness, tingling or new weaknesses Behavioral/Psych: Mood is stable, no new changes  Breast:  denies any  pain or lumps or nodules in either breasts All other systems were reviewed with the patient and are negative.  I have reviewed the past medical history, past surgical history, social history and family history with the patient and they are unchanged from previous note.  ALLERGIES:  is allergic to codeine.  MEDICATIONS:  Current Outpatient Prescriptions  Medication Sig Dispense Refill  . ALPRAZolam (XANAX) 0.5 MG tablet Take 0.5 mg by mouth at bedtime as needed.     Marland Kitchen anastrozole (ARIMIDEX) 1 MG tablet TAKE 1 TABLET (1 MG TOTAL) BY MOUTH DAILY. 90 tablet 1  . aspirin 81 MG tablet Take 81 mg by mouth daily.      Marland Kitchen atorvastatin (LIPITOR) 20 MG tablet Take 20 mg by mouth once.     . gabapentin (NEURONTIN) 300 MG capsule Take 1 capsule (300 mg total) by mouth 2 (two) times daily. 60 capsule 5  . HYDROcodone-acetaminophen (NORCO) 5-325 MG per tablet Take 1 tablet by mouth every 6 (six) hours as needed.    . metFORMIN (GLUCOPHAGE-XR) 500 MG 24 hr tablet Take 1,000 mg by mouth 2 (two) times daily.     . nabumetone (RELAFEN) 750 MG tablet Take 750 mg by mouth 2 (two) times daily.    Marland Kitchen venlafaxine (EFFEXOR) 25 MG tablet TAKE 1.5 TABLETS (37.5 MG TOTAL) BY MOUTH DAILY. 45 tablet 2   No current facility-administered medications for this visit.    PHYSICAL EXAMINATION: ECOG PERFORMANCE STATUS: 0 - Asymptomatic  Filed Vitals:   03/19/15 1033  BP: 132/76  Pulse: 76  Temp: 97.9 F (36.6 C)  Resp: 18  Filed Weights   03/19/15 1033  Weight: 155 lb 4.8 oz (70.444 kg)    GENERAL:alert, no distress and comfortable SKIN: skin color, texture, turgor are normal, no rashes or significant lesions EYES: normal, Conjunctiva are pink and non-injected, sclera clear OROPHARYNX:no exudate, no erythema and lips, buccal mucosa, and tongue normal  NECK: supple, thyroid normal size, non-tender, without nodularity LYMPH:  no palpable lymphadenopathy in the cervical, axillary or inguinal LUNGS: clear to  auscultation and percussion with normal breathing effort HEART: regular rate & rhythm and no murmurs and no lower extremity edema ABDOMEN:abdomen soft, non-tender and normal bowel sounds Musculoskeletal:no cyanosis of digits and no clubbing  NEURO: alert & oriented x 3 with fluent speech, no focal motor/sensory deficits BREAST: No palpable masses or nodules in either right or left breasts. No palpable axillary supraclavicular or infraclavicular adenopathy no breast tenderness or nipple discharge. (exam performed in the presence of a chaperone)  LABORATORY DATA:  I have reviewed the data as listed   Chemistry      Component Value Date/Time   NA 141 07/09/2014 1529   NA 136 03/15/2012 1041   K 3.8 07/09/2014 1529   K 4.2 03/15/2012 1041   CL 101 10/08/2012 0951   CL 101 03/15/2012 1041   CO2 26 07/09/2014 1529   CO2 28 03/15/2012 1041   BUN 16.5 07/09/2014 1529   BUN 22 03/15/2012 1041   CREATININE 0.8 07/09/2014 1529   CREATININE 0.90 03/15/2012 1041      Component Value Date/Time   CALCIUM 9.1 07/09/2014 1529   CALCIUM 9.3 03/15/2012 1041   ALKPHOS 86 07/09/2014 1529   ALKPHOS 73 03/15/2012 1041   AST 18 07/09/2014 1529   AST 21 03/15/2012 1041   ALT 15 07/09/2014 1529   ALT 16 03/15/2012 1041   BILITOT 0.49 07/09/2014 1529   BILITOT 1.0 03/15/2012 1041       Lab Results  Component Value Date   WBC 4.2 03/19/2015   HGB 12.6 03/19/2015   HCT 37.7 03/19/2015   MCV 88.1 03/19/2015   PLT 189 03/19/2015   NEUTROABS 2.2 03/19/2015    ASSESSMENT & PLAN:  Breast cancer of upper-inner quadrant of right female breast Right breast invasive ductal carcinoma T1 C. N0 M0 ER/PR positive HER-2 negative status post lumpectomy and radiation in December 2012, currently on anastrozole since February 2013.  Anastrozole Toxicities: No major side effects  Breast Cancer Surveillance: 1. Breast exam 03/19/15: Normal 2. Mammogram 08/21/14 No abnormalities. Postsurgical changes. Breast  Density Category C. I recommended that she get 3-D mammograms for surveillance. Discussed the differences between different breast density categories.   Osteopenia: Patient will need a bone density test in 2016. Patient thought that she could drink milk and spend some outdoor time in the sun to get vitamin D but she has not been doing any of these. We will have to reassess her bones when she comes back in December 2016 and determine if she should go on a bisphosphonate therapy. I strongly believe that she could benefit from it.  No orders of the defined types were placed in this encounter.   The patient has a good understanding of the overall plan. she agrees with it. she will call with any problems that may develop before the next visit here.   Rulon Eisenmenger, MD

## 2015-05-04 ENCOUNTER — Other Ambulatory Visit: Payer: Self-pay | Admitting: Hematology and Oncology

## 2015-05-04 NOTE — Telephone Encounter (Signed)
Last ov 03/19/15   Next ov 12/2  Chart reviewed

## 2015-05-19 DIAGNOSIS — Z6825 Body mass index (BMI) 25.0-25.9, adult: Secondary | ICD-10-CM | POA: Diagnosis not present

## 2015-05-19 DIAGNOSIS — Z0000 Encounter for general adult medical examination without abnormal findings: Secondary | ICD-10-CM | POA: Diagnosis not present

## 2015-05-19 DIAGNOSIS — C50919 Malignant neoplasm of unspecified site of unspecified female breast: Secondary | ICD-10-CM | POA: Diagnosis not present

## 2015-05-19 DIAGNOSIS — E119 Type 2 diabetes mellitus without complications: Secondary | ICD-10-CM | POA: Diagnosis not present

## 2015-05-19 DIAGNOSIS — E782 Mixed hyperlipidemia: Secondary | ICD-10-CM | POA: Diagnosis not present

## 2015-05-19 DIAGNOSIS — Z1389 Encounter for screening for other disorder: Secondary | ICD-10-CM | POA: Diagnosis not present

## 2015-07-31 ENCOUNTER — Other Ambulatory Visit: Payer: Self-pay | Admitting: Hematology and Oncology

## 2015-07-31 DIAGNOSIS — Z853 Personal history of malignant neoplasm of breast: Secondary | ICD-10-CM

## 2015-08-24 ENCOUNTER — Ambulatory Visit
Admission: RE | Admit: 2015-08-24 | Discharge: 2015-08-24 | Disposition: A | Discharge status: Home / Self Care | Payer: Medicare Other | Source: Ambulatory Visit | Attending: Hematology and Oncology | Admitting: Hematology and Oncology

## 2015-08-24 DIAGNOSIS — Z853 Personal history of malignant neoplasm of breast: Secondary | ICD-10-CM

## 2015-08-24 DIAGNOSIS — R922 Inconclusive mammogram: Secondary | ICD-10-CM | POA: Diagnosis not present

## 2015-10-16 ENCOUNTER — Ambulatory Visit
Admission: RE | Admit: 2015-10-16 | Discharge: 2015-10-16 | Disposition: A | Discharge status: Home / Self Care | Payer: Medicare Other | Source: Ambulatory Visit | Attending: Hematology and Oncology | Admitting: Hematology and Oncology

## 2015-10-16 DIAGNOSIS — Z853 Personal history of malignant neoplasm of breast: Secondary | ICD-10-CM

## 2015-10-16 DIAGNOSIS — M810 Age-related osteoporosis without current pathological fracture: Secondary | ICD-10-CM

## 2015-10-16 DIAGNOSIS — M85852 Other specified disorders of bone density and structure, left thigh: Secondary | ICD-10-CM | POA: Diagnosis not present

## 2015-10-29 DIAGNOSIS — E119 Type 2 diabetes mellitus without complications: Secondary | ICD-10-CM | POA: Diagnosis not present

## 2015-10-30 ENCOUNTER — Other Ambulatory Visit: Payer: Self-pay | Admitting: Hematology and Oncology

## 2015-10-30 NOTE — Telephone Encounter (Signed)
Chart reviewed.  Last bone density significant change - osteopenia.  Appt 12/20.  Refilled for 1 month supply with 1 refill only.

## 2015-11-03 ENCOUNTER — Ambulatory Visit (HOSPITAL_BASED_OUTPATIENT_CLINIC_OR_DEPARTMENT_OTHER): Payer: Medicare Other | Admitting: Hematology and Oncology

## 2015-11-03 ENCOUNTER — Telehealth: Payer: Self-pay | Admitting: Hematology and Oncology

## 2015-11-03 ENCOUNTER — Encounter: Payer: Self-pay | Admitting: Hematology and Oncology

## 2015-11-03 VITALS — BP 126/74 | HR 77 | Temp 98.1°F | Resp 18 | Wt 156.6 lb

## 2015-11-03 DIAGNOSIS — M8580 Other specified disorders of bone density and structure, unspecified site: Secondary | ICD-10-CM | POA: Diagnosis not present

## 2015-11-03 DIAGNOSIS — C50211 Malignant neoplasm of upper-inner quadrant of right female breast: Secondary | ICD-10-CM

## 2015-11-03 NOTE — Progress Notes (Signed)
Patient Care Team: Redmond School, MD as PCP - General (Internal Medicine) Selinda Orion, MD (Obstetrics and Gynecology) Consuela Mimes, MD (Internal Medicine) Eppie Gibson, MD (Radiation Oncology)  DIAGNOSIS: Breast cancer of upper-inner quadrant of right female breast [facility])   Staging form: Breast, AJCC 7th Edition     Clinical: No stage assigned - Unsigned       Prognostic indicators: ER PR Pos      Pathologic: Stage IA (T1, N0, cM0) - Unsigned       Prognostic indicators: ER PR Pos    SUMMARY OF ONCOLOGIC HISTORY:   Breast cancer of upper-inner quadrant of right female breast ([place])   09/07/2011 Initial Diagnosis Cancer of upper-inner quadrant of female breast   10/25/2011 Surgery Right breast lumpectomy; 1.2 cm IDC No LVI; 3 SLN Neg; Er 100%, PR 100%; Her 2 Neg; Ki 67: 7%, T1CN0Mo (stage IA)   12/05/2011 - 12/30/2011 Radiation Therapy Radiation to lumpectomy site   01/11/2012 -  Anti-estrogen oral therapy Arimidex 1 mg daily    CHIEF COMPLIANT: Follow-up on Arimidex  INTERVAL HISTORY: [name] is a 69 year old with above-mentioned history of right breast cancer currently on hormonal therapy with Arimidex. She is tolerating it extremely well without any major problems or concerns. Denies hot flashes or myalgias. She had recent bone density test which showed severe osteopenia. Denies any lumps or nodules in the breasts.  REVIEW OF SYSTEMS:   Constitutional: Denies fevers, chills or abnormal weight loss Eyes: Denies blurriness of vision Ears, nose, mouth, throat, and face: Denies mucositis or sore throat Respiratory: Denies cough, dyspnea or wheezes Cardiovascular: Denies palpitation, chest discomfort or lower extremity swelling Gastrointestinal:  Denies nausea, heartburn or change in bowel habits Skin: Denies abnormal skin rashes Lymphatics: Denies new lymphadenopathy or easy bruising Neurological:Denies numbness, tingling or new weaknesses Behavioral/Psych: Mood is  stable, no new changes  Breast:  denies any pain or lumps or nodules in either breasts All other systems were reviewed with the patient and are negative.  I have reviewed the past medical history, past surgical history, social history and family history with the patient and they are unchanged from previous note.  ALLERGIES:  is allergic to codeine.  MEDICATIONS:  Current Outpatient Prescriptions  Medication Sig Dispense Refill  . ALPRAZolam (XANAX) 0.5 MG tablet Take 0.5 mg by mouth at bedtime as needed.     Marland Kitchen anastrozole (ARIMIDEX) 1 MG tablet TAKE 1 TABLET BY MOUTH EVERY DAY 30 tablet 1  . aspirin 81 MG tablet Take 81 mg by mouth daily.      Marland Kitchen atorvastatin (LIPITOR) 40 MG tablet Take 40 mg by mouth daily.  0  . gabapentin (NEURONTIN) 300 MG capsule Take 1 capsule (300 mg total) by mouth 2 (two) times daily. (Patient taking differently: Take 600 mg by mouth daily. ) 60 capsule 5  . metFORMIN (GLUCOPHAGE-XR) 500 MG 24 hr tablet Take 1,000 mg by mouth 2 (two) times daily.     . ranitidine (ZANTAC) 75 MG tablet Take 75 mg by mouth as needed for heartburn.    . traMADol (ULTRAM) 50 MG tablet     . venlafaxine (EFFEXOR) 25 MG tablet TAKE 1.5 TABLETS (37.5 MG TOTAL) BY MOUTH DAILY. 45 tablet 2   No current facility-administered medications for this visit.    PHYSICAL EXAMINATION: ECOG PERFORMANCE STATUS: 0 - Asymptomatic  Filed Vitals:   11/03/15 1035  BP: 126/74  Pulse: 77  Temp: 98.1 F (36.7 C)  Resp: 18  Filed Weights   11/03/15 1035  Weight: 156 lb 9.6 oz (71.033 kg)    GENERAL:alert, no distress and comfortable SKIN: skin color, texture, turgor are normal, no rashes or significant lesions EYES: normal, Conjunctiva are pink and non-injected, sclera clear OROPHARYNX:no exudate, no erythema and lips, buccal mucosa, and tongue normal  NECK: supple, thyroid normal size, non-tender, without nodularity LYMPH:  no palpable lymphadenopathy in the cervical, axillary or  inguinal LUNGS: clear to auscultation and percussion with normal breathing effort HEART: regular rate & rhythm and no murmurs and no lower extremity edema ABDOMEN:abdomen soft, non-tender and normal bowel sounds Musculoskeletal:no cyanosis of digits and no clubbing  NEURO: alert & oriented x 3 with fluent speech, no focal motor/sensory deficits BREAST: No palpable masses or nodules in either right or left breasts. No palpable axillary supraclavicular or infraclavicular adenopathy no breast tenderness or nipple discharge. (exam performed in the presence of a chaperone)  LABORATORY DATA:  I have reviewed the data as listed   Chemistry      Component Value Date/Time   NA 141 03/19/2015 1019   NA 136 03/15/2012 1041   K 4.3 03/19/2015 1019   K 4.2 03/15/2012 1041   CL 101 10/08/2012 0951   CL 101 03/15/2012 1041   CO2 26 03/19/2015 1019   CO2 28 03/15/2012 1041   BUN 20.1 03/19/2015 1019   BUN 22 03/15/2012 1041   CREATININE 0.8 03/19/2015 1019   CREATININE 0.90 03/15/2012 1041      Component Value Date/Time   CALCIUM 9.2 03/19/2015 1019   CALCIUM 9.3 03/15/2012 1041   ALKPHOS 80 03/19/2015 1019   ALKPHOS 73 03/15/2012 1041   AST 16 03/19/2015 1019   AST 21 03/15/2012 1041   ALT 14 03/19/2015 1019   ALT 16 03/15/2012 1041   BILITOT 0.53 03/19/2015 1019   BILITOT 1.0 03/15/2012 1041       Lab Results  Component Value Date   WBC 4.2 03/19/2015   HGB 12.6 03/19/2015   HCT 37.7 03/19/2015   MCV 88.1 03/19/2015   PLT 189 03/19/2015   NEUTROABS 2.2 03/19/2015   ASSESSMENT & PLAN:  Breast cancer of upper-inner quadrant of right female breast Right breast invasive ductal carcinoma T1 C. N0 M0 ER/PR positive HER-2 negative status post lumpectomy and radiation in December 2012, currently on anastrozole since February 2013.  Anastrozole Toxicities: No major side effects  Breast Cancer Surveillance: 1. Breast exam 11/03/15: Normal 2. Mammogram 08/24/15 No abnormalities.  Postsurgical changes. Breast Density Category C. I recommended that she get 3-D mammograms for surveillance. Discussed the differences between different breast density categories.  Osteopenia: bone density done 10/16/2015: T score -2.3 osteopenia (risk of major osteoporotic fracture 13%) I recommended bisphosphonate therapy. We discussed different options including Fosamax, Boniva, prolia exam injection. Because she has stopped and taking pills, we elected to use prolia every 6 months  Return to clinic in 1 year for follow-up   No orders of the defined types were placed in this encounter.   The patient has a good understanding of the overall plan. she agrees with it. she will call with any problems that may develop before the next visit here.   Rulon Eisenmenger, MD 11/03/2015

## 2015-11-03 NOTE — Telephone Encounter (Signed)
Appointments made and avs printed for patient °

## 2015-11-03 NOTE — Assessment & Plan Note (Signed)
Right breast invasive ductal carcinoma T1 C. N0 M0 ER/PR positive HER-2 negative status post lumpectomy and radiation in December 2012, currently on anastrozole since February 2013.  Anastrozole Toxicities: No major side effects  Breast Cancer Surveillance: 1. Breast exam 11/03/15: Normal 2. Mammogram 08/21/14 No abnormalities. Postsurgical changes. Breast Density Category C. I recommended that she get 3-D mammograms for surveillance. Discussed the differences between different breast density categories.  Osteopenia: bone density done 10/16/2015: T score -2.3 osteopenia (risk of major osteoporotic fracture 13%) I recommended bisphosphonate therapy. We discussed different options including Fosamax, Boniva, probably injection.  Return to clinic in 1 year for follow-up

## 2015-11-05 ENCOUNTER — Other Ambulatory Visit: Payer: Self-pay | Admitting: Hematology and Oncology

## 2015-11-06 ENCOUNTER — Other Ambulatory Visit: Payer: Self-pay | Admitting: *Deleted

## 2015-11-06 DIAGNOSIS — C50211 Malignant neoplasm of upper-inner quadrant of right female breast: Secondary | ICD-10-CM

## 2015-11-06 MED ORDER — VENLAFAXINE HCL 25 MG PO TABS: ORAL_TABLET | ORAL | Status: DC

## 2015-11-17 ENCOUNTER — Other Ambulatory Visit (HOSPITAL_BASED_OUTPATIENT_CLINIC_OR_DEPARTMENT_OTHER): Payer: Medicare Other

## 2015-11-17 ENCOUNTER — Ambulatory Visit (HOSPITAL_BASED_OUTPATIENT_CLINIC_OR_DEPARTMENT_OTHER): Payer: Medicare Other

## 2015-11-17 VITALS — BP 124/70 | HR 66 | Temp 98.4°F

## 2015-11-17 DIAGNOSIS — M8580 Other specified disorders of bone density and structure, unspecified site: Secondary | ICD-10-CM

## 2015-11-17 DIAGNOSIS — C50211 Malignant neoplasm of upper-inner quadrant of right female breast: Secondary | ICD-10-CM | POA: Diagnosis not present

## 2015-11-17 LAB — COMPREHENSIVE METABOLIC PANEL
ALT: 15 U/L (ref 0–55)
ANION GAP: 10 meq/L (ref 3–11)
AST: 17 U/L (ref 5–34)
Albumin: 4 g/dL (ref 3.5–5.0)
Alkaline Phosphatase: 78 U/L (ref 40–150)
BUN: 24.3 mg/dL (ref 7.0–26.0)
CO2: 25 meq/L (ref 22–29)
Calcium: 9.6 mg/dL (ref 8.4–10.4)
Chloride: 105 mEq/L (ref 98–109)
Creatinine: 0.9 mg/dL (ref 0.6–1.1)
EGFR: 62 mL/min/{1.73_m2} — AB (ref >90)
GLUCOSE: 99 mg/dL (ref 70–140)
POTASSIUM: 4.4 meq/L (ref 3.5–5.1)
Sodium: 140 mEq/L (ref 136–145)
Total Bilirubin: 0.63 mg/dL (ref 0.20–1.20)
Total Protein: 7.4 g/dL (ref 6.4–8.3)

## 2015-11-17 MED ORDER — DENOSUMAB 60 MG/ML ~~LOC~~ SOLN
60.0000 mg | Freq: Once | SUBCUTANEOUS | Status: AC
  Administered 2015-11-17: 60 mg via SUBCUTANEOUS
  Filled 2015-11-17: qty 1

## 2015-11-17 NOTE — Patient Instructions (Signed)
Denosumab injection  What is this medicine?  DENOSUMAB (den oh sue mab) slows bone breakdown. Prolia is used to treat osteoporosis in women after menopause and in men. Xgeva is used to prevent bone fractures and other bone problems caused by cancer bone metastases. Xgeva is also used to treat giant cell tumor of the bone.  This medicine may be used for other purposes; ask your health care provider or pharmacist if you have questions.  What should I tell my health care provider before I take this medicine?  They need to know if you have any of these conditions:  -dental disease  -eczema  -infection or history of infections  -kidney disease or on dialysis  -low blood calcium or vitamin D  -malabsorption syndrome  -scheduled to have surgery or tooth extraction  -taking medicine that contains denosumab  -thyroid or parathyroid disease  -an unusual reaction to denosumab, other medicines, foods, dyes, or preservatives  -pregnant or trying to get pregnant  -breast-feeding  How should I use this medicine?  This medicine is for injection under the skin. It is given by a health care professional in a hospital or clinic setting.  If you are getting Prolia, a special MedGuide will be given to you by the pharmacist with each prescription and refill. Be sure to read this information carefully each time.  For Prolia, talk to your pediatrician regarding the use of this medicine in children. Special care may be needed. For Xgeva, talk to your pediatrician regarding the use of this medicine in children. While this drug may be prescribed for children as young as 13 years for selected conditions, precautions do apply.  Overdosage: If you think you have taken too much of this medicine contact a poison control center or emergency room at once.  NOTE: This medicine is only for you. Do not share this medicine with others.  What if I miss a dose?  It is important not to miss your dose. Call your doctor or health care professional if you are  unable to keep an appointment.  What may interact with this medicine?  Do not take this medicine with any of the following medications:  -other medicines containing denosumab  This medicine may also interact with the following medications:  -medicines that suppress the immune system  -medicines that treat cancer  -steroid medicines like prednisone or cortisone  This list may not describe all possible interactions. Give your health care provider a list of all the medicines, herbs, non-prescription drugs, or dietary supplements you use. Also tell them if you smoke, drink alcohol, or use illegal drugs. Some items may interact with your medicine.  What should I watch for while using this medicine?  Visit your doctor or health care professional for regular checks on your progress. Your doctor or health care professional may order blood tests and other tests to see how you are doing.  Call your doctor or health care professional if you get a cold or other infection while receiving this medicine. Do not treat yourself. This medicine may decrease your body's ability to fight infection.  You should make sure you get enough calcium and vitamin D while you are taking this medicine, unless your doctor tells you not to. Discuss the foods you eat and the vitamins you take with your health care professional.  See your dentist regularly. Brush and floss your teeth as directed. Before you have any dental work done, tell your dentist you are receiving this medicine.  Do   not become pregnant while taking this medicine or for 5 months after stopping it. Women should inform their doctor if they wish to become pregnant or think they might be pregnant. There is a potential for serious side effects to an unborn child. Talk to your health care professional or pharmacist for more information.  What side effects may I notice from receiving this medicine?  Side effects that you should report to your doctor or health care professional as soon as  possible:  -allergic reactions like skin rash, itching or hives, swelling of the face, lips, or tongue  -breathing problems  -chest pain  -fast, irregular heartbeat  -feeling faint or lightheaded, falls  -fever, chills, or any other sign of infection  -muscle spasms, tightening, or twitches  -numbness or tingling  -skin blisters or bumps, or is dry, peels, or red  -slow healing or unexplained pain in the mouth or jaw  -unusual bleeding or bruising  Side effects that usually do not require medical attention (Report these to your doctor or health care professional if they continue or are bothersome.):  -muscle pain  -stomach upset, gas  This list may not describe all possible side effects. Call your doctor for medical advice about side effects. You may report side effects to FDA at 1-800-FDA-1088.  Where should I keep my medicine?  This medicine is only given in a clinic, doctor's office, or other health care setting and will not be stored at home.  NOTE: This sheet is a summary. It may not cover all possible information. If you have questions about this medicine, talk to your doctor, pharmacist, or health care provider.      2016, [employer]. (2012-04-30 12:37:47)

## 2015-11-25 ENCOUNTER — Ambulatory Visit (HOSPITAL_COMMUNITY)
Admission: RE | Admit: 2015-11-25 | Discharge: 2015-11-25 | Disposition: A | Discharge status: Home / Self Care | Payer: Medicare Other | Source: Ambulatory Visit | Attending: Internal Medicine | Admitting: Internal Medicine

## 2015-11-25 ENCOUNTER — Other Ambulatory Visit (HOSPITAL_COMMUNITY): Payer: Self-pay | Admitting: Internal Medicine

## 2015-11-25 DIAGNOSIS — S99921A Unspecified injury of right foot, initial encounter: Secondary | ICD-10-CM | POA: Diagnosis not present

## 2015-11-25 DIAGNOSIS — M7731 Calcaneal spur, right foot: Secondary | ICD-10-CM | POA: Diagnosis not present

## 2015-11-25 DIAGNOSIS — X58XXXA Exposure to other specified factors, initial encounter: Secondary | ICD-10-CM | POA: Diagnosis not present

## 2015-11-25 DIAGNOSIS — Z1389 Encounter for screening for other disorder: Secondary | ICD-10-CM | POA: Diagnosis not present

## 2015-11-25 DIAGNOSIS — E1140 Type 2 diabetes mellitus with diabetic neuropathy, unspecified: Secondary | ICD-10-CM | POA: Diagnosis not present

## 2015-11-25 DIAGNOSIS — M79671 Pain in right foot: Secondary | ICD-10-CM | POA: Diagnosis not present

## 2015-11-25 DIAGNOSIS — F419 Anxiety disorder, unspecified: Secondary | ICD-10-CM | POA: Diagnosis not present

## 2015-11-25 DIAGNOSIS — M19071 Primary osteoarthritis, right ankle and foot: Secondary | ICD-10-CM | POA: Diagnosis not present

## 2015-11-25 DIAGNOSIS — I10 Essential (primary) hypertension: Secondary | ICD-10-CM | POA: Diagnosis not present

## 2015-11-25 DIAGNOSIS — M779 Enthesopathy, unspecified: Secondary | ICD-10-CM | POA: Diagnosis not present

## 2015-12-30 ENCOUNTER — Other Ambulatory Visit: Payer: Self-pay | Admitting: Hematology and Oncology

## 2015-12-30 NOTE — Telephone Encounter (Signed)
Chart reviewed.

## 2016-02-19 DIAGNOSIS — C50211 Malignant neoplasm of upper-inner quadrant of right female breast: Secondary | ICD-10-CM | POA: Diagnosis not present

## 2016-03-04 ENCOUNTER — Other Ambulatory Visit: Payer: Self-pay | Admitting: Hematology and Oncology

## 2016-03-04 NOTE — Telephone Encounter (Signed)
Chart reviewed.

## 2016-05-12 ENCOUNTER — Telehealth: Payer: Self-pay | Admitting: Hematology and Oncology

## 2016-05-12 NOTE — Telephone Encounter (Signed)
Lvm advising appt chgd from 7/3 to 7/5 @ 2.15 due to md pal.

## 2016-05-16 ENCOUNTER — Other Ambulatory Visit: Payer: Medicare Other

## 2016-05-16 ENCOUNTER — Ambulatory Visit: Payer: Medicare Other | Admitting: Hematology and Oncology

## 2016-05-16 ENCOUNTER — Ambulatory Visit: Payer: Medicare Other

## 2016-05-16 ENCOUNTER — Other Ambulatory Visit: Payer: Self-pay

## 2016-05-16 DIAGNOSIS — M8580 Other specified disorders of bone density and structure, unspecified site: Secondary | ICD-10-CM

## 2016-05-16 DIAGNOSIS — C50211 Malignant neoplasm of upper-inner quadrant of right female breast: Secondary | ICD-10-CM

## 2016-05-17 NOTE — Assessment & Plan Note (Signed)
Right breast invasive ductal carcinoma T1 C. N0 M0 ER/PR positive HER-2 negative status post lumpectomy and radiation in December 2012, currently on anastrozole since February 2013.  Anastrozole Toxicities: No major side effects  Breast Cancer Surveillance: 1. Breast exam 11/03/15: Normal 2. Mammogram 08/24/15 No abnormalities. Postsurgical changes. Breast Density Category C. I recommended that she get 3-D mammograms for surveillance. Discussed the differences between different breast density categories.  Osteopenia: bone density done 10/16/2015: T score -2.3 osteopenia (risk of major osteoporotic fracture 13%) I recommended bisphosphonate therapy. We discussed different options including Fosamax, Boniva, prolia exam injection. Because she has stopped and taking pills, we elected to use prolia every 6 months  Return to clinic in 1 year for follow-up

## 2016-05-18 ENCOUNTER — Other Ambulatory Visit (HOSPITAL_BASED_OUTPATIENT_CLINIC_OR_DEPARTMENT_OTHER): Payer: Medicare Other

## 2016-05-18 ENCOUNTER — Encounter: Payer: Self-pay | Admitting: Hematology and Oncology

## 2016-05-18 ENCOUNTER — Ambulatory Visit (HOSPITAL_BASED_OUTPATIENT_CLINIC_OR_DEPARTMENT_OTHER): Payer: Medicare Other

## 2016-05-18 ENCOUNTER — Ambulatory Visit (HOSPITAL_BASED_OUTPATIENT_CLINIC_OR_DEPARTMENT_OTHER): Payer: Medicare Other | Admitting: Hematology and Oncology

## 2016-05-18 ENCOUNTER — Telehealth: Payer: Self-pay | Admitting: Hematology and Oncology

## 2016-05-18 VITALS — BP 139/75 | HR 66 | Temp 98.7°F | Resp 18 | Ht 64.0 in | Wt 156.0 lb

## 2016-05-18 DIAGNOSIS — C50211 Malignant neoplasm of upper-inner quadrant of right female breast: Secondary | ICD-10-CM

## 2016-05-18 DIAGNOSIS — Z79811 Long term (current) use of aromatase inhibitors: Secondary | ICD-10-CM

## 2016-05-18 DIAGNOSIS — M8580 Other specified disorders of bone density and structure, unspecified site: Secondary | ICD-10-CM | POA: Diagnosis not present

## 2016-05-18 DIAGNOSIS — Z170 Estrogen receptor positive status [ER+]: Secondary | ICD-10-CM

## 2016-05-18 LAB — CBC WITH DIFFERENTIAL/PLATELET
BASO%: 0.8 % (ref 0.0–2.0)
Basophils Absolute: 0 10e3/uL (ref 0.0–0.1)
EOS%: 1.7 % (ref 0.0–7.0)
Eosinophils Absolute: 0.1 10e3/uL (ref 0.0–0.5)
HCT: 36.4 % (ref 34.8–46.6)
HGB: 12 g/dL (ref 11.6–15.9)
LYMPH%: 35.1 % (ref 14.0–49.7)
MCH: 29 pg (ref 25.1–34.0)
MCHC: 33 g/dL (ref 31.5–36.0)
MCV: 87.9 fL (ref 79.5–101.0)
MONO#: 0.4 10e3/uL (ref 0.1–0.9)
MONO%: 10.1 % (ref 0.0–14.0)
NEUT#: 2.2 10e3/uL (ref 1.5–6.5)
NEUT%: 52.3 % (ref 38.4–76.8)
Platelets: 197 10e3/uL (ref 145–400)
RBC: 4.15 10e6/uL (ref 3.70–5.45)
RDW: 14.1 % (ref 11.2–14.5)
WBC: 4.1 10e3/uL (ref 3.9–10.3)
lymph#: 1.4 10e3/uL (ref 0.9–3.3)

## 2016-05-18 LAB — COMPREHENSIVE METABOLIC PANEL WITH GFR
ALT: 17 U/L (ref 0–55)
AST: 16 U/L (ref 5–34)
Albumin: 3.6 g/dL (ref 3.5–5.0)
Alkaline Phosphatase: 67 U/L (ref 40–150)
Anion Gap: 9 meq/L (ref 3–11)
BUN: 19.2 mg/dL (ref 7.0–26.0)
CO2: 25 meq/L (ref 22–29)
Calcium: 9.2 mg/dL (ref 8.4–10.4)
Chloride: 106 meq/L (ref 98–109)
Creatinine: 1.1 mg/dL (ref 0.6–1.1)
EGFR: 53 ml/min/1.73 m2 — ABNORMAL LOW
Glucose: 111 mg/dL (ref 70–140)
Potassium: 4.6 meq/L (ref 3.5–5.1)
Sodium: 140 meq/L (ref 136–145)
Total Bilirubin: 0.49 mg/dL (ref 0.20–1.20)
Total Protein: 7 g/dL (ref 6.4–8.3)

## 2016-05-18 MED ORDER — DENOSUMAB 60 MG/ML ~~LOC~~ SOLN
60.0000 mg | Freq: Once | SUBCUTANEOUS | Status: AC
  Administered 2016-05-18: 60 mg via SUBCUTANEOUS
  Filled 2016-05-18: qty 1

## 2016-05-18 NOTE — Patient Instructions (Signed)
Denosumab injection  What is this medicine?  DENOSUMAB (den oh sue mab) slows bone breakdown. Prolia is used to treat osteoporosis in women after menopause and in men. Xgeva is used to prevent bone fractures and other bone problems caused by cancer bone metastases. Xgeva is also used to treat giant cell tumor of the bone.  This medicine may be used for other purposes; ask your health care provider or pharmacist if you have questions.  What should I tell my health care provider before I take this medicine?  They need to know if you have any of these conditions:  -dental disease  -eczema  -infection or history of infections  -kidney disease or on dialysis  -low blood calcium or vitamin D  -malabsorption syndrome  -scheduled to have surgery or tooth extraction  -taking medicine that contains denosumab  -thyroid or parathyroid disease  -an unusual reaction to denosumab, other medicines, foods, dyes, or preservatives  -pregnant or trying to get pregnant  -breast-feeding  How should I use this medicine?  This medicine is for injection under the skin. It is given by a health care professional in a hospital or clinic setting.  If you are getting Prolia, a special MedGuide will be given to you by the pharmacist with each prescription and refill. Be sure to read this information carefully each time.  For Prolia, talk to your pediatrician regarding the use of this medicine in children. Special care may be needed. For Xgeva, talk to your pediatrician regarding the use of this medicine in children. While this drug may be prescribed for children as young as 13 years for selected conditions, precautions do apply.  Overdosage: If you think you have taken too much of this medicine contact a poison control center or emergency room at once.  NOTE: This medicine is only for you. Do not share this medicine with others.  What if I miss a dose?  It is important not to miss your dose. Call your doctor or health care professional if you are  unable to keep an appointment.  What may interact with this medicine?  Do not take this medicine with any of the following medications:  -other medicines containing denosumab  This medicine may also interact with the following medications:  -medicines that suppress the immune system  -medicines that treat cancer  -steroid medicines like prednisone or cortisone  This list may not describe all possible interactions. Give your health care provider a list of all the medicines, herbs, non-prescription drugs, or dietary supplements you use. Also tell them if you smoke, drink alcohol, or use illegal drugs. Some items may interact with your medicine.  What should I watch for while using this medicine?  Visit your doctor or health care professional for regular checks on your progress. Your doctor or health care professional may order blood tests and other tests to see how you are doing.  Call your doctor or health care professional if you get a cold or other infection while receiving this medicine. Do not treat yourself. This medicine may decrease your body's ability to fight infection.  You should make sure you get enough calcium and vitamin D while you are taking this medicine, unless your doctor tells you not to. Discuss the foods you eat and the vitamins you take with your health care professional.  See your dentist regularly. Brush and floss your teeth as directed. Before you have any dental work done, tell your dentist you are receiving this medicine.  Do   not become pregnant while taking this medicine or for 5 months after stopping it. Women should inform their doctor if they wish to become pregnant or think they might be pregnant. There is a potential for serious side effects to an unborn child. Talk to your health care professional or pharmacist for more information.  What side effects may I notice from receiving this medicine?  Side effects that you should report to your doctor or health care professional as soon as  possible:  -allergic reactions like skin rash, itching or hives, swelling of the face, lips, or tongue  -breathing problems  -chest pain  -fast, irregular heartbeat  -feeling faint or lightheaded, falls  -fever, chills, or any other sign of infection  -muscle spasms, tightening, or twitches  -numbness or tingling  -skin blisters or bumps, or is dry, peels, or red  -slow healing or unexplained pain in the mouth or jaw  -unusual bleeding or bruising  Side effects that usually do not require medical attention (Report these to your doctor or health care professional if they continue or are bothersome.):  -muscle pain  -stomach upset, gas  This list may not describe all possible side effects. Call your doctor for medical advice about side effects. You may report side effects to FDA at 1-800-FDA-1088.  Where should I keep my medicine?  This medicine is only given in a clinic, doctor's office, or other health care setting and will not be stored at home.  NOTE: This sheet is a summary. It may not cover all possible information. If you have questions about this medicine, talk to your doctor, pharmacist, or health care provider.      2016, [employer]. (2012-04-30 12:37:47)

## 2016-05-18 NOTE — Progress Notes (Signed)
Patient Care Team: Redmond School, MD as PCP - General (Internal Medicine) Selinda Orion, MD (Obstetrics and Gynecology) Consuela Mimes, MD (Internal Medicine) Eppie Gibson, MD (Radiation Oncology)  DIAGNOSIS: Breast cancer of upper-inner quadrant of right female breast [facility])   Staging form: Breast, AJCC 7th Edition     Clinical: No stage assigned - Unsigned       Prognostic indicators: ER PR Pos      Pathologic: Stage IA (T1, N0, cM0) - Unsigned       Prognostic indicators: ER PR Pos  SUMMARY OF ONCOLOGIC HISTORY:   Breast cancer of upper-inner quadrant of right female breast ([place])   09/07/2011 Initial Diagnosis Cancer of upper-inner quadrant of female breast   10/25/2011 Surgery Right breast lumpectomy; 1.2 cm IDC No LVI; 3 SLN Neg; Er 100%, PR 100%; Her 2 Neg; Ki 67: 7%, T1CN0Mo (stage IA)   12/05/2011 - 12/30/2011 Radiation Therapy Radiation to lumpectomy site   01/11/2012 -  Anti-estrogen oral therapy Arimidex 1 mg daily    CHIEF COMPLIANT: Follow-up on Arimidex  INTERVAL HISTORY: [name] is a 70 year old with above-mentioned history right breast cancer currently on Arimidex therapy. She appears to be tolerating it extremely well. She reports no major problems or concerns. She does have osteoporosis for which she is currently on Prolia. She denies any lumps or nodules in the breasts.  REVIEW OF SYSTEMS:   Constitutional: Denies fevers, chills or abnormal weight loss Eyes: Denies blurriness of vision Ears, nose, mouth, throat, and face: Denies mucositis or sore throat Respiratory: Denies cough, dyspnea or wheezes Cardiovascular: Denies palpitation, chest discomfort Gastrointestinal:  Denies nausea, heartburn or change in bowel habits Skin: Denies abnormal skin rashes Lymphatics: Denies new lymphadenopathy or easy bruising Neurological:Denies numbness, tingling or new weaknesses Behavioral/Psych: Mood is stable, no new changes  Extremities: No lower extremity  edema Breast:  denies any pain or lumps or nodules in either breasts All other systems were reviewed with the patient and are negative.  I have reviewed the past medical history, past surgical history, social history and family history with the patient and they are unchanged from previous note.  ALLERGIES:  is allergic to codeine.  MEDICATIONS:  Current Outpatient Prescriptions  Medication Sig Dispense Refill  . ALPRAZolam (XANAX) 0.5 MG tablet Take 0.5 mg by mouth at bedtime as needed.     Marland Kitchen anastrozole (ARIMIDEX) 1 MG tablet TAKE 1 TABLET BY MOUTH EVERY DAY 30 tablet 5  . aspirin 81 MG tablet Take 81 mg by mouth daily.      Marland Kitchen atorvastatin (LIPITOR) 40 MG tablet Take 40 mg by mouth daily.  0  . gabapentin (NEURONTIN) 300 MG capsule Take 1 capsule (300 mg total) by mouth 2 (two) times daily. (Patient taking differently: Take 600 mg by mouth daily. ) 60 capsule 5  . metFORMIN (GLUCOPHAGE-XR) 500 MG 24 hr tablet Take 1,000 mg by mouth 2 (two) times daily.     . ranitidine (ZANTAC) 75 MG tablet Take 75 mg by mouth as needed for heartburn.    . traMADol (ULTRAM) 50 MG tablet     . venlafaxine (EFFEXOR) 25 MG tablet TAKE 1 & 1/2 TABLETS BY MOUTH DAILY. 45 tablet 5   No current facility-administered medications for this visit.    PHYSICAL EXAMINATION: ECOG PERFORMANCE STATUS: 0 - Asymptomatic  Filed Vitals:   05/18/16 1436  BP: 139/75  Pulse: 66  Temp: 98.7 F (37.1 C)  Resp: 18   Filed Weights  05/18/16 1436  Weight: 156 lb (70.761 kg)    GENERAL:alert, no distress and comfortable SKIN: skin color, texture, turgor are normal, no rashes or significant lesions EYES: normal, Conjunctiva are pink and non-injected, sclera clear OROPHARYNX:no exudate, no erythema and lips, buccal mucosa, and tongue normal  NECK: supple, thyroid normal size, non-tender, without nodularity LYMPH:  no palpable lymphadenopathy in the cervical, axillary or inguinal LUNGS: clear to auscultation and  percussion with normal breathing effort HEART: regular rate & rhythm and no murmurs and no lower extremity edema ABDOMEN:abdomen soft, non-tender and normal bowel sounds MUSCULOSKELETAL:no cyanosis of digits and no clubbing  NEURO: alert & oriented x 3 with fluent speech, no focal motor/sensory deficits EXTREMITIES: No lower extremity edema BREAST: No palpable masses or nodules in either right or left breasts. No palpable axillary supraclavicular or infraclavicular adenopathy no breast tenderness or nipple discharge. (exam performed in the presence of a chaperone)  LABORATORY DATA:  I have reviewed the data as listed   Chemistry      Component Value Date/Time   NA 140 11/17/2015 1025   NA 136 03/15/2012 1041   K 4.4 11/17/2015 1025   K 4.2 03/15/2012 1041   CL 101 10/08/2012 0951   CL 101 03/15/2012 1041   CO2 25 11/17/2015 1025   CO2 28 03/15/2012 1041   BUN 24.3 11/17/2015 1025   BUN 22 03/15/2012 1041   CREATININE 0.9 11/17/2015 1025   CREATININE 0.90 03/15/2012 1041      Component Value Date/Time   CALCIUM 9.6 11/17/2015 1025   CALCIUM 9.3 03/15/2012 1041   ALKPHOS 78 11/17/2015 1025   ALKPHOS 73 03/15/2012 1041   AST 17 11/17/2015 1025   AST 21 03/15/2012 1041   ALT 15 11/17/2015 1025   ALT 16 03/15/2012 1041   BILITOT 0.63 11/17/2015 1025   BILITOT 1.0 03/15/2012 1041       Lab Results  Component Value Date   WBC 4.1 05/18/2016   HGB 12.0 05/18/2016   HCT 36.4 05/18/2016   MCV 87.9 05/18/2016   PLT 197 05/18/2016   NEUTROABS 2.2 05/18/2016     ASSESSMENT & PLAN:  Breast cancer of upper-inner quadrant of right female breast Right breast invasive ductal carcinoma T1 C. N0 M0 ER/PR positive HER-2 negative status post lumpectomy and radiation in December 2012, currently on anastrozole since February 2013.  Anastrozole Toxicities: No major side effects Patient will finish 5 years of therapy by February 2018. After that she plans to discontinue antiestrogen  therapy.  Breast Cancer Surveillance: 1. Breast exam 11/03/15: Normal 2. Mammogram 08/24/15 No abnormalities. Postsurgical changes. Breast Density Category C. I recommended that she get 3-D mammograms for surveillance. Discussed the differences between different breast density categories.  Osteopenia: bone density done 10/16/2015: T score -2.3 osteopenia (risk of major osteoporotic fracture 13%) On prolia every 6 months  Return to clinic in 1 year for follow-up and after that we can send her up to survivorship clinic.   No orders of the defined types were placed in this encounter.   The patient has a good understanding of the overall plan. she agrees with it. she will call with any problems that may develop before the next visit here.   Rulon Eisenmenger, MD 05/18/2016

## 2016-05-18 NOTE — Telephone Encounter (Signed)
per pof to sch pt appt-gave pt copy of avs °

## 2016-07-13 ENCOUNTER — Other Ambulatory Visit: Payer: Self-pay | Admitting: Hematology and Oncology

## 2016-09-16 ENCOUNTER — Other Ambulatory Visit: Payer: Self-pay | Admitting: Hematology and Oncology

## 2016-10-24 DIAGNOSIS — I10 Essential (primary) hypertension: Secondary | ICD-10-CM | POA: Diagnosis not present

## 2016-10-24 DIAGNOSIS — R201 Hypoesthesia of skin: Secondary | ICD-10-CM | POA: Diagnosis not present

## 2016-10-24 DIAGNOSIS — E1140 Type 2 diabetes mellitus with diabetic neuropathy, unspecified: Secondary | ICD-10-CM | POA: Diagnosis not present

## 2016-10-24 DIAGNOSIS — Z1389 Encounter for screening for other disorder: Secondary | ICD-10-CM | POA: Diagnosis not present

## 2016-10-24 DIAGNOSIS — E119 Type 2 diabetes mellitus without complications: Secondary | ICD-10-CM | POA: Diagnosis not present

## 2017-01-16 ENCOUNTER — Other Ambulatory Visit: Payer: Self-pay | Admitting: *Deleted

## 2017-02-02 DIAGNOSIS — C50211 Malignant neoplasm of upper-inner quadrant of right female breast: Secondary | ICD-10-CM | POA: Diagnosis not present

## 2017-04-12 DIAGNOSIS — E782 Mixed hyperlipidemia: Secondary | ICD-10-CM | POA: Diagnosis not present

## 2017-04-12 DIAGNOSIS — I10 Essential (primary) hypertension: Secondary | ICD-10-CM | POA: Diagnosis not present

## 2017-04-12 DIAGNOSIS — E119 Type 2 diabetes mellitus without complications: Secondary | ICD-10-CM | POA: Diagnosis not present

## 2017-04-12 DIAGNOSIS — Z0000 Encounter for general adult medical examination without abnormal findings: Secondary | ICD-10-CM | POA: Diagnosis not present

## 2017-04-12 DIAGNOSIS — Z1389 Encounter for screening for other disorder: Secondary | ICD-10-CM | POA: Diagnosis not present

## 2017-04-12 DIAGNOSIS — E1140 Type 2 diabetes mellitus with diabetic neuropathy, unspecified: Secondary | ICD-10-CM | POA: Diagnosis not present

## 2017-04-13 DIAGNOSIS — E782 Mixed hyperlipidemia: Secondary | ICD-10-CM | POA: Diagnosis not present

## 2017-04-13 DIAGNOSIS — Z1389 Encounter for screening for other disorder: Secondary | ICD-10-CM | POA: Diagnosis not present

## 2017-04-18 DIAGNOSIS — D510 Vitamin B12 deficiency anemia due to intrinsic factor deficiency: Secondary | ICD-10-CM | POA: Diagnosis not present

## 2017-05-22 ENCOUNTER — Encounter: Payer: Self-pay | Admitting: Hematology and Oncology

## 2017-05-22 ENCOUNTER — Ambulatory Visit (HOSPITAL_BASED_OUTPATIENT_CLINIC_OR_DEPARTMENT_OTHER): Payer: Medicare Other | Admitting: Hematology and Oncology

## 2017-05-22 ENCOUNTER — Ambulatory Visit: Payer: Medicare Other

## 2017-05-22 ENCOUNTER — Ambulatory Visit (HOSPITAL_BASED_OUTPATIENT_CLINIC_OR_DEPARTMENT_OTHER): Payer: Medicare Other

## 2017-05-22 VITALS — BP 118/69 | HR 63 | Temp 98.0°F | Resp 18 | Ht 64.0 in | Wt 153.8 lb

## 2017-05-22 DIAGNOSIS — Z170 Estrogen receptor positive status [ER+]: Principal | ICD-10-CM

## 2017-05-22 DIAGNOSIS — M8580 Other specified disorders of bone density and structure, unspecified site: Secondary | ICD-10-CM

## 2017-05-22 DIAGNOSIS — Z853 Personal history of malignant neoplasm of breast: Secondary | ICD-10-CM | POA: Diagnosis not present

## 2017-05-22 DIAGNOSIS — C50211 Malignant neoplasm of upper-inner quadrant of right female breast: Secondary | ICD-10-CM

## 2017-05-22 LAB — COMPREHENSIVE METABOLIC PANEL
ALT: 18 U/L (ref 0–55)
ANION GAP: 8 meq/L (ref 3–11)
AST: 18 U/L (ref 5–34)
Albumin: 4 g/dL (ref 3.5–5.0)
Alkaline Phosphatase: 92 U/L (ref 40–150)
BILIRUBIN TOTAL: 0.55 mg/dL (ref 0.20–1.20)
BUN: 15.3 mg/dL (ref 7.0–26.0)
CALCIUM: 9.9 mg/dL (ref 8.4–10.4)
CO2: 28 meq/L (ref 22–29)
CREATININE: 0.9 mg/dL (ref 0.6–1.1)
Chloride: 104 mEq/L (ref 98–109)
EGFR: 63 mL/min/{1.73_m2} — ABNORMAL LOW (ref >90)
Glucose: 98 mg/dl (ref 70–140)
Potassium: 4.8 mEq/L (ref 3.5–5.1)
Sodium: 140 mEq/L (ref 136–145)
TOTAL PROTEIN: 7.2 g/dL (ref 6.4–8.3)

## 2017-05-22 MED ORDER — DENOSUMAB 60 MG/ML ~~LOC~~ SOLN
60.0000 mg | Freq: Once | SUBCUTANEOUS | Status: DC
  Filled 2017-05-22: qty 1

## 2017-05-22 MED ORDER — DENOSUMAB 60 MG/ML ~~LOC~~ SOLN
60.0000 mg | Freq: Once | SUBCUTANEOUS | Status: AC
  Administered 2017-05-22: 60 mg via SUBCUTANEOUS
  Filled 2017-05-22: qty 1

## 2017-05-22 NOTE — Progress Notes (Signed)
Patient Care Team: Redmond School, MD as PCP - General (Internal Medicine) [name] Vanessa Lowenstein, MD (Inactive) (Obstetrics and Gynecology) Vanessa Panning, MD (Internal Medicine) Vanessa Gibson, MD (Radiation Oncology)  DIAGNOSIS:  Encounter Diagnoses  Name Primary?  . Malignant neoplasm of upper-inner quadrant of right breast in female, estrogen receptor positive ([place]) Yes  . Osteopenia, unspecified location     SUMMARY OF ONCOLOGIC HISTORY:   Breast cancer of upper-inner quadrant of right female breast ([name])   09/07/2011 Initial Diagnosis    Cancer of upper-inner quadrant of female breast      10/25/2011 Surgery    Right breast lumpectomy; 1.2 cm IDC No LVI; 3 SLN Neg; Er 100%, PR 100%; Her 2 Neg; Ki 67: 7%, T1CN0Mo (stage IA)      12/05/2011 - 12/30/2011 Radiation Therapy    Radiation to lumpectomy site      01/11/2012 - 12/23/2016 Anti-estrogen oral therapy    Arimidex 1 mg daily       CHIEF COMPLIANT: Surveillance of breast cancer  INTERVAL HISTORY: [name] is a 71 year old with above-mentioned history of right breast cancer who completed 5 years of antiestrogen therapy and is here today for surveillance checkup. She reports no new problems or concerns. Denies any lumps or nodules in the breast  REVIEW OF SYSTEMS:   Constitutional: Denies fevers, chills or abnormal weight loss Eyes: Denies blurriness of vision Ears, nose, mouth, throat, and face: Denies mucositis or sore throat Respiratory: Denies cough, dyspnea or wheezes Cardiovascular: Denies palpitation, chest discomfort Gastrointestinal:  Denies nausea, heartburn or change in bowel habits Skin: Denies abnormal skin rashes Lymphatics: Denies new lymphadenopathy or easy bruising Neurological:Denies numbness, tingling or new weaknesses Behavioral/Psych: Mood is stable, no new changes  Extremities: No lower extremity edema Breast:  denies any pain or lumps or nodules in either breasts All other systems were  reviewed with the patient and are negative.  I have reviewed the past medical history, past surgical history, social history and family history with the patient and they are unchanged from previous note.  ALLERGIES:  is allergic to codeine.  MEDICATIONS:  Current Outpatient Prescriptions  Medication Sig Dispense Refill  . ALPRAZolam (XANAX) 0.5 MG tablet Take 0.5 mg by mouth at bedtime as needed.     Marland Kitchen aspirin 81 MG tablet Take 81 mg by mouth daily.      Marland Kitchen atorvastatin (LIPITOR) 40 MG tablet Take 40 mg by mouth daily.  0  . gabapentin (NEURONTIN) 300 MG capsule Take 1 capsule (300 mg total) by mouth 2 (two) times daily. (Patient taking differently: Take 600 mg by mouth daily. ) 60 capsule 5  . metFORMIN (GLUCOPHAGE-XR) 500 MG 24 hr tablet Take 1,000 mg by mouth 2 (two) times daily.     . ranitidine (ZANTAC) 75 MG tablet Take 75 mg by mouth as needed for heartburn.    . traMADol (ULTRAM) 50 MG tablet     . venlafaxine (EFFEXOR) 25 MG tablet TAKE 1 & 1/2 TABLETS BY MOUTH DAILY. 45 tablet 5   Current Facility-Administered Medications  Medication Dose Route Frequency Provider Last Rate Last Dose  . denosumab (PROLIA) injection 60 mg  60 mg Subcutaneous Once Vanessa Lose, MD        PHYSICAL EXAMINATION: ECOG PERFORMANCE STATUS: 1 - Symptomatic but completely ambulatory  Vitals:   05/22/17 1056  BP: 118/69  Pulse: 63  Resp: 18  Temp: 98 F (36.7 C)   Filed Weights   05/22/17 1056  Weight:  153 lb 12.8 oz (69.8 kg)    GENERAL:alert, no distress and comfortable SKIN: skin color, texture, turgor are normal, no rashes or significant lesions EYES: normal, Conjunctiva are pink and non-injected, sclera clear OROPHARYNX:no exudate, no erythema and lips, buccal mucosa, and tongue normal  NECK: supple, thyroid normal size, non-tender, without nodularity LYMPH:  no palpable lymphadenopathy in the cervical, axillary or inguinal LUNGS: clear to auscultation and percussion with normal  breathing effort HEART: regular rate & rhythm and no murmurs and no lower extremity edema ABDOMEN:abdomen soft, non-tender and normal bowel sounds MUSCULOSKELETAL:no cyanosis of digits and no clubbing  NEURO: alert & oriented x 3 with fluent speech, no focal motor/sensory deficits EXTREMITIES: No lower extremity edema BREAST: No palpable masses or nodules in either right or left breasts. No palpable axillary supraclavicular or infraclavicular adenopathy no breast tenderness or nipple discharge. (exam performed in the presence of a chaperone)  LABORATORY DATA:  I have reviewed the data as listed   Chemistry      Component Value Date/Time   NA 140 05/18/2016 1409   K 4.6 05/18/2016 1409   CL 101 10/08/2012 0951   CO2 25 05/18/2016 1409   BUN 19.2 05/18/2016 1409   CREATININE 1.1 05/18/2016 1409      Component Value Date/Time   CALCIUM 9.2 05/18/2016 1409   ALKPHOS 67 05/18/2016 1409   AST 16 05/18/2016 1409   ALT 17 05/18/2016 1409   BILITOT 0.49 05/18/2016 1409       Lab Results  Component Value Date   WBC 4.1 05/18/2016   HGB 12.0 05/18/2016   HCT 36.4 05/18/2016   MCV 87.9 05/18/2016   PLT 197 05/18/2016   NEUTROABS 2.2 05/18/2016    ASSESSMENT & PLAN:  Breast cancer of upper-inner quadrant of right female breast Right breast invasive ductal carcinoma T1 C. N0 M0 ER/PR positive HER-2 negative status post lumpectomy and radiation in December 2012, currently on anastrozole since February 2013 completed February 2018.  Breast Cancer Surveillance: 1. Breast exam 05/22/2017: Normal 2. Mammogram 08/23/16 No abnormalities. Postsurgical changes. Breast Density Category C. I recommended that she get 3-D mammograms for surveillance.  Osteopenia: bone density done 10/16/2015: T score -2.3 osteopenia (risk of major osteoporotic fracture 13%) On prolia every 6 months (she missed a probably injection 6 months ago)  Return to clinic in 1 year for follow-up with survivorship  clinic.   I spent 25 minutes talking to the patient of which more than half was spent in counseling and coordination of care.  No orders of the defined types were placed in this encounter.  The patient has a good understanding of the overall plan. she agrees with it. she will call with any problems that may develop before the next visit here.   Rulon Eisenmenger, MD 05/22/17

## 2017-05-22 NOTE — Addendum Note (Signed)
Addended by: Thelma Barge MAY J on: 05/22/2017 11:29 AM   Modules accepted: Orders

## 2017-05-22 NOTE — Assessment & Plan Note (Signed)
Right breast invasive ductal carcinoma T1 C. N0 M0 ER/PR positive HER-2 negative status post lumpectomy and radiation in December 2012, currently on anastrozole since February 2013 completed February 2018.  Breast Cancer Surveillance: 1. Breast exam 05/22/2017: Normal 2. Mammogram 08/23/16 No abnormalities. Postsurgical changes. Breast Density Category C. I recommended that she get 3-D mammograms for surveillance.  Osteopenia: bone density done 10/16/2015: T score -2.3 osteopenia (risk of major osteoporotic fracture 13%) On prolia every 6 months (she missed a probably injection 6 months ago)  Return to clinic in 1 year for follow-up with survivorship clinic.

## 2017-05-22 NOTE — Patient Instructions (Signed)
Denosumab injection  What is this medicine?  DENOSUMAB (den oh sue mab) slows bone breakdown. Prolia is used to treat osteoporosis in women after menopause and in men. Xgeva is used to prevent bone fractures and other bone problems caused by cancer bone metastases. Xgeva is also used to treat giant cell tumor of the bone.  This medicine may be used for other purposes; ask your health care provider or pharmacist if you have questions.  What should I tell my health care provider before I take this medicine?  They need to know if you have any of these conditions:  -dental disease  -eczema  -infection or history of infections  -kidney disease or on dialysis  -low blood calcium or vitamin D  -malabsorption syndrome  -scheduled to have surgery or tooth extraction  -taking medicine that contains denosumab  -thyroid or parathyroid disease  -an unusual reaction to denosumab, other medicines, foods, dyes, or preservatives  -pregnant or trying to get pregnant  -breast-feeding  How should I use this medicine?  This medicine is for injection under the skin. It is given by a health care professional in a hospital or clinic setting.  If you are getting Prolia, a special MedGuide will be given to you by the pharmacist with each prescription and refill. Be sure to read this information carefully each time.  For Prolia, talk to your pediatrician regarding the use of this medicine in children. Special care may be needed. For Xgeva, talk to your pediatrician regarding the use of this medicine in children. While this drug may be prescribed for children as young as 13 years for selected conditions, precautions do apply.  Overdosage: If you think you have taken too much of this medicine contact a poison control center or emergency room at once.  NOTE: This medicine is only for you. Do not share this medicine with others.  What if I miss a dose?  It is important not to miss your dose. Call your doctor or health care professional if you are  unable to keep an appointment.  What may interact with this medicine?  Do not take this medicine with any of the following medications:  -other medicines containing denosumab  This medicine may also interact with the following medications:  -medicines that suppress the immune system  -medicines that treat cancer  -steroid medicines like prednisone or cortisone  This list may not describe all possible interactions. Give your health care provider a list of all the medicines, herbs, non-prescription drugs, or dietary supplements you use. Also tell them if you smoke, drink alcohol, or use illegal drugs. Some items may interact with your medicine.  What should I watch for while using this medicine?  Visit your doctor or health care professional for regular checks on your progress. Your doctor or health care professional may order blood tests and other tests to see how you are doing.  Call your doctor or health care professional if you get a cold or other infection while receiving this medicine. Do not treat yourself. This medicine may decrease your body's ability to fight infection.  You should make sure you get enough calcium and vitamin D while you are taking this medicine, unless your doctor tells you not to. Discuss the foods you eat and the vitamins you take with your health care professional.  See your dentist regularly. Brush and floss your teeth as directed. Before you have any dental work done, tell your dentist you are receiving this medicine.  Do   not become pregnant while taking this medicine or for 5 months after stopping it. Women should inform their doctor if they wish to become pregnant or think they might be pregnant. There is a potential for serious side effects to an unborn child. Talk to your health care professional or pharmacist for more information.  What side effects may I notice from receiving this medicine?  Side effects that you should report to your doctor or health care professional as soon as  possible:  -allergic reactions like skin rash, itching or hives, swelling of the face, lips, or tongue  -breathing problems  -chest pain  -fast, irregular heartbeat  -feeling faint or lightheaded, falls  -fever, chills, or any other sign of infection  -muscle spasms, tightening, or twitches  -numbness or tingling  -skin blisters or bumps, or is dry, peels, or red  -slow healing or unexplained pain in the mouth or jaw  -unusual bleeding or bruising  Side effects that usually do not require medical attention (Report these to your doctor or health care professional if they continue or are bothersome.):  -muscle pain  -stomach upset, gas  This list may not describe all possible side effects. Call your doctor for medical advice about side effects. You may report side effects to FDA at 1-800-FDA-1088.  Where should I keep my medicine?  This medicine is only given in a clinic, doctor's office, or other health care setting and will not be stored at home.  NOTE: This sheet is a summary. It may not cover all possible information. If you have questions about this medicine, talk to your doctor, pharmacist, or health care provider.      2016, [employer]. (2012-04-30 12:37:47)

## 2017-05-23 DIAGNOSIS — D510 Vitamin B12 deficiency anemia due to intrinsic factor deficiency: Secondary | ICD-10-CM | POA: Diagnosis not present

## 2017-06-19 DIAGNOSIS — D510 Vitamin B12 deficiency anemia due to intrinsic factor deficiency: Secondary | ICD-10-CM | POA: Diagnosis not present

## 2017-07-18 DIAGNOSIS — D510 Vitamin B12 deficiency anemia due to intrinsic factor deficiency: Secondary | ICD-10-CM | POA: Diagnosis not present

## 2017-08-11 DIAGNOSIS — E538 Deficiency of other specified B group vitamins: Secondary | ICD-10-CM | POA: Diagnosis not present

## 2017-08-28 ENCOUNTER — Other Ambulatory Visit: Payer: Self-pay

## 2017-08-28 NOTE — Patient Outreach (Signed)
[facility]) Care Management  08/28/2017  GAYE SCORZA 09-08-1946 [id number]   Medication Adherence call to Mrs. Emmamae Mcnamara the reason for this call is because Mrs. Punt is showing past due under [facility] Ins.on metformin 500 mg spoke to patient she said she still has some medication and sometimes she forgets to take the medication but still has some and does not need any at this time.   [place] Management Direct Dial [phone]  Fax [phone] [email or web address]

## 2017-09-14 DIAGNOSIS — E538 Deficiency of other specified B group vitamins: Secondary | ICD-10-CM | POA: Diagnosis not present

## 2017-10-10 DIAGNOSIS — E1140 Type 2 diabetes mellitus with diabetic neuropathy, unspecified: Secondary | ICD-10-CM | POA: Diagnosis not present

## 2017-10-10 DIAGNOSIS — E538 Deficiency of other specified B group vitamins: Secondary | ICD-10-CM | POA: Diagnosis not present

## 2017-10-10 DIAGNOSIS — E782 Mixed hyperlipidemia: Secondary | ICD-10-CM | POA: Diagnosis not present

## 2017-10-10 DIAGNOSIS — I10 Essential (primary) hypertension: Secondary | ICD-10-CM | POA: Diagnosis not present

## 2017-11-20 DIAGNOSIS — D510 Vitamin B12 deficiency anemia due to intrinsic factor deficiency: Secondary | ICD-10-CM | POA: Diagnosis not present

## 2017-11-21 ENCOUNTER — Telehealth: Payer: Self-pay | Admitting: Hematology and Oncology

## 2017-11-21 ENCOUNTER — Other Ambulatory Visit: Payer: Self-pay

## 2017-11-21 ENCOUNTER — Other Ambulatory Visit: Payer: Self-pay | Admitting: Hematology and Oncology

## 2017-11-21 DIAGNOSIS — Z170 Estrogen receptor positive status [ER+]: Principal | ICD-10-CM

## 2017-11-21 DIAGNOSIS — C50211 Malignant neoplasm of upper-inner quadrant of right female breast: Secondary | ICD-10-CM

## 2017-11-21 NOTE — Progress Notes (Signed)
Sent message to scheduling to add on Lab prior to injection appt.  Called and left VM for pt to notify her of lab appt needed before injection at 10 am.

## 2017-11-21 NOTE — Telephone Encounter (Signed)
Scheduled appt per 1/8 sch msg. Left voicemail for patient regarding appts.

## 2017-11-22 ENCOUNTER — Inpatient Hospital Stay: Payer: Medicare Other

## 2017-11-22 ENCOUNTER — Inpatient Hospital Stay: Payer: Medicare Other | Attending: Hematology and Oncology

## 2017-11-22 VITALS — BP 135/78 | HR 65 | Temp 97.9°F | Resp 20

## 2017-11-22 DIAGNOSIS — M8580 Other specified disorders of bone density and structure, unspecified site: Secondary | ICD-10-CM | POA: Diagnosis not present

## 2017-11-22 DIAGNOSIS — Z79899 Other long term (current) drug therapy: Secondary | ICD-10-CM | POA: Diagnosis not present

## 2017-11-22 DIAGNOSIS — C50211 Malignant neoplasm of upper-inner quadrant of right female breast: Secondary | ICD-10-CM

## 2017-11-22 DIAGNOSIS — Z170 Estrogen receptor positive status [ER+]: Principal | ICD-10-CM

## 2017-11-22 LAB — CMP (CANCER CENTER ONLY)
ALBUMIN: 3.7 g/dL (ref 3.5–5.0)
ALT: 15 U/L (ref 0–55)
AST: 14 U/L (ref 5–34)
Alkaline Phosphatase: 51 U/L (ref 40–150)
Anion gap: 7 (ref 3–11)
BUN: 17 mg/dL (ref 7–26)
CO2: 28 mmol/L (ref 22–29)
Calcium: 9.2 mg/dL (ref 8.4–10.4)
Chloride: 106 mmol/L (ref 98–109)
Creatinine: 1.01 mg/dL (ref 0.70–1.30)
GFR, EST NON AFRICAN AMERICAN: 55 mL/min — AB (ref >60)
GFR, Est AFR Am: >60 mL/min (ref >60)
GLUCOSE: 96 mg/dL (ref 70–140)
POTASSIUM: 4.9 mmol/L — AB (ref 3.3–4.7)
Sodium: 141 mmol/L (ref 136–145)
Total Bilirubin: 0.5 mg/dL (ref 0.2–1.2)
Total Protein: 6.7 g/dL (ref 6.4–8.3)

## 2017-11-22 MED ORDER — DENOSUMAB 60 MG/ML ~~LOC~~ SOLN
60.0000 mg | Freq: Once | SUBCUTANEOUS | Status: AC
  Administered 2017-11-22: 60 mg via SUBCUTANEOUS

## 2017-11-22 NOTE — Patient Instructions (Signed)
Denosumab injection  What is this medicine?  DENOSUMAB (den oh sue mab) slows bone breakdown. Prolia is used to treat osteoporosis in women after menopause and in men. Xgeva is used to prevent bone fractures and other bone problems caused by cancer bone metastases. Xgeva is also used to treat giant cell tumor of the bone.  This medicine may be used for other purposes; ask your health care provider or pharmacist if you have questions.  What should I tell my health care provider before I take this medicine?  They need to know if you have any of these conditions:  -dental disease  -eczema  -infection or history of infections  -kidney disease or on dialysis  -low blood calcium or vitamin D  -malabsorption syndrome  -scheduled to have surgery or tooth extraction  -taking medicine that contains denosumab  -thyroid or parathyroid disease  -an unusual reaction to denosumab, other medicines, foods, dyes, or preservatives  -pregnant or trying to get pregnant  -breast-feeding  How should I use this medicine?  This medicine is for injection under the skin. It is given by a health care professional in a hospital or clinic setting.  If you are getting Prolia, a special MedGuide will be given to you by the pharmacist with each prescription and refill. Be sure to read this information carefully each time.  For Prolia, talk to your pediatrician regarding the use of this medicine in children. Special care may be needed. For Xgeva, talk to your pediatrician regarding the use of this medicine in children. While this drug may be prescribed for children as young as 13 years for selected conditions, precautions do apply.  Overdosage: If you think you have taken too much of this medicine contact a poison control center or emergency room at once.  NOTE: This medicine is only for you. Do not share this medicine with others.  What if I miss a dose?  It is important not to miss your dose. Call your doctor or health care professional if you are  unable to keep an appointment.  What may interact with this medicine?  Do not take this medicine with any of the following medications:  -other medicines containing denosumab  This medicine may also interact with the following medications:  -medicines that suppress the immune system  -medicines that treat cancer  -steroid medicines like prednisone or cortisone  This list may not describe all possible interactions. Give your health care provider a list of all the medicines, herbs, non-prescription drugs, or dietary supplements you use. Also tell them if you smoke, drink alcohol, or use illegal drugs. Some items may interact with your medicine.  What should I watch for while using this medicine?  Visit your doctor or health care professional for regular checks on your progress. Your doctor or health care professional may order blood tests and other tests to see how you are doing.  Call your doctor or health care professional if you get a cold or other infection while receiving this medicine. Do not treat yourself. This medicine may decrease your body's ability to fight infection.  You should make sure you get enough calcium and vitamin D while you are taking this medicine, unless your doctor tells you not to. Discuss the foods you eat and the vitamins you take with your health care professional.  See your dentist regularly. Brush and floss your teeth as directed. Before you have any dental work done, tell your dentist you are receiving this medicine.  Do   not become pregnant while taking this medicine or for 5 months after stopping it. Women should inform their doctor if they wish to become pregnant or think they might be pregnant. There is a potential for serious side effects to an unborn child. Talk to your health care professional or pharmacist for more information.  What side effects may I notice from receiving this medicine?  Side effects that you should report to your doctor or health care professional as soon as  possible:  -allergic reactions like skin rash, itching or hives, swelling of the face, lips, or tongue  -breathing problems  -chest pain  -fast, irregular heartbeat  -feeling faint or lightheaded, falls  -fever, chills, or any other sign of infection  -muscle spasms, tightening, or twitches  -numbness or tingling  -skin blisters or bumps, or is dry, peels, or red  -slow healing or unexplained pain in the mouth or jaw  -unusual bleeding or bruising  Side effects that usually do not require medical attention (Report these to your doctor or health care professional if they continue or are bothersome.):  -muscle pain  -stomach upset, gas  This list may not describe all possible side effects. Call your doctor for medical advice about side effects. You may report side effects to FDA at 1-800-FDA-1088.  Where should I keep my medicine?  This medicine is only given in a clinic, doctor's office, or other health care setting and will not be stored at home.  NOTE: This sheet is a summary. It may not cover all possible information. If you have questions about this medicine, talk to your doctor, pharmacist, or health care provider.      2016, [employer]. (2012-04-30 12:37:47)

## 2017-12-18 DIAGNOSIS — D510 Vitamin B12 deficiency anemia due to intrinsic factor deficiency: Secondary | ICD-10-CM | POA: Diagnosis not present

## 2018-01-15 DIAGNOSIS — D510 Vitamin B12 deficiency anemia due to intrinsic factor deficiency: Secondary | ICD-10-CM | POA: Diagnosis not present

## 2018-02-12 ENCOUNTER — Other Ambulatory Visit (HOSPITAL_COMMUNITY): Payer: Self-pay | Admitting: Physician Assistant

## 2018-02-12 ENCOUNTER — Ambulatory Visit (HOSPITAL_COMMUNITY)
Admission: RE | Admit: 2018-02-12 | Discharge: 2018-02-12 | Disposition: A | Discharge status: Home / Self Care | Payer: Medicare Other | Source: Ambulatory Visit | Attending: Physician Assistant | Admitting: Physician Assistant

## 2018-02-12 DIAGNOSIS — E538 Deficiency of other specified B group vitamins: Secondary | ICD-10-CM | POA: Diagnosis not present

## 2018-02-12 DIAGNOSIS — M25552 Pain in left hip: Secondary | ICD-10-CM | POA: Diagnosis not present

## 2018-02-12 DIAGNOSIS — S79912A Unspecified injury of left hip, initial encounter: Secondary | ICD-10-CM | POA: Diagnosis not present

## 2018-02-12 DIAGNOSIS — Z1389 Encounter for screening for other disorder: Secondary | ICD-10-CM | POA: Diagnosis not present

## 2018-02-14 ENCOUNTER — Other Ambulatory Visit: Payer: Self-pay | Admitting: Internal Medicine

## 2018-02-14 DIAGNOSIS — Z1231 Encounter for screening mammogram for malignant neoplasm of breast: Secondary | ICD-10-CM

## 2018-03-15 DIAGNOSIS — E538 Deficiency of other specified B group vitamins: Secondary | ICD-10-CM | POA: Diagnosis not present

## 2018-03-15 DIAGNOSIS — M792 Neuralgia and neuritis, unspecified: Secondary | ICD-10-CM | POA: Diagnosis not present

## 2018-03-15 DIAGNOSIS — M25552 Pain in left hip: Secondary | ICD-10-CM | POA: Diagnosis not present

## 2018-03-15 DIAGNOSIS — I10 Essential (primary) hypertension: Secondary | ICD-10-CM | POA: Diagnosis not present

## 2018-03-15 DIAGNOSIS — Z1389 Encounter for screening for other disorder: Secondary | ICD-10-CM | POA: Diagnosis not present

## 2018-03-19 ENCOUNTER — Other Ambulatory Visit: Payer: Self-pay | Admitting: Internal Medicine

## 2018-03-19 ENCOUNTER — Ambulatory Visit
Admission: RE | Admit: 2018-03-19 | Discharge: 2018-03-19 | Disposition: A | Discharge status: Home / Self Care | Payer: Medicare Other | Source: Ambulatory Visit | Attending: Internal Medicine | Admitting: Internal Medicine

## 2018-03-19 DIAGNOSIS — Z1231 Encounter for screening mammogram for malignant neoplasm of breast: Secondary | ICD-10-CM

## 2018-03-19 DIAGNOSIS — Z9889 Other specified postprocedural states: Secondary | ICD-10-CM

## 2018-03-19 DIAGNOSIS — R922 Inconclusive mammogram: Secondary | ICD-10-CM | POA: Diagnosis not present

## 2018-03-19 HISTORY — DX: Personal history of antineoplastic chemotherapy: Z92.21

## 2018-03-20 ENCOUNTER — Other Ambulatory Visit: Payer: Self-pay | Admitting: Internal Medicine

## 2018-03-22 DIAGNOSIS — M25552 Pain in left hip: Secondary | ICD-10-CM | POA: Diagnosis not present

## 2018-03-22 DIAGNOSIS — M545 Low back pain: Secondary | ICD-10-CM | POA: Diagnosis not present

## 2018-04-23 DIAGNOSIS — D510 Vitamin B12 deficiency anemia due to intrinsic factor deficiency: Secondary | ICD-10-CM | POA: Diagnosis not present

## 2018-05-15 DIAGNOSIS — D510 Vitamin B12 deficiency anemia due to intrinsic factor deficiency: Secondary | ICD-10-CM | POA: Diagnosis not present

## 2018-05-21 DIAGNOSIS — Z1389 Encounter for screening for other disorder: Secondary | ICD-10-CM | POA: Diagnosis not present

## 2018-05-21 DIAGNOSIS — E1140 Type 2 diabetes mellitus with diabetic neuropathy, unspecified: Secondary | ICD-10-CM | POA: Diagnosis not present

## 2018-05-21 DIAGNOSIS — Z0000 Encounter for general adult medical examination without abnormal findings: Secondary | ICD-10-CM | POA: Diagnosis not present

## 2018-05-21 DIAGNOSIS — E782 Mixed hyperlipidemia: Secondary | ICD-10-CM | POA: Diagnosis not present

## 2018-05-21 DIAGNOSIS — Z0001 Encounter for general adult medical examination with abnormal findings: Secondary | ICD-10-CM | POA: Diagnosis not present

## 2018-05-21 DIAGNOSIS — E119 Type 2 diabetes mellitus without complications: Secondary | ICD-10-CM | POA: Diagnosis not present

## 2018-05-21 DIAGNOSIS — I10 Essential (primary) hypertension: Secondary | ICD-10-CM | POA: Diagnosis not present

## 2018-05-21 DIAGNOSIS — Z13220 Encounter for screening for lipoid disorders: Secondary | ICD-10-CM | POA: Diagnosis not present

## 2018-05-21 DIAGNOSIS — G894 Chronic pain syndrome: Secondary | ICD-10-CM | POA: Diagnosis not present

## 2018-05-22 ENCOUNTER — Other Ambulatory Visit: Payer: Self-pay | Admitting: Adult Health

## 2018-05-22 ENCOUNTER — Telehealth: Payer: Self-pay | Admitting: Adult Health

## 2018-05-22 ENCOUNTER — Encounter: Payer: Self-pay | Admitting: Adult Health

## 2018-05-22 ENCOUNTER — Inpatient Hospital Stay: Payer: Medicare Other | Attending: Adult Health | Admitting: Adult Health

## 2018-05-22 ENCOUNTER — Inpatient Hospital Stay: Payer: Medicare Other

## 2018-05-22 VITALS — BP 129/76 | HR 70 | Temp 98.4°F | Resp 18 | Ht 64.0 in | Wt 155.0 lb

## 2018-05-22 DIAGNOSIS — K219 Gastro-esophageal reflux disease without esophagitis: Secondary | ICD-10-CM | POA: Diagnosis not present

## 2018-05-22 DIAGNOSIS — Z9221 Personal history of antineoplastic chemotherapy: Secondary | ICD-10-CM

## 2018-05-22 DIAGNOSIS — Z7984 Long term (current) use of oral hypoglycemic drugs: Secondary | ICD-10-CM | POA: Insufficient documentation

## 2018-05-22 DIAGNOSIS — E119 Type 2 diabetes mellitus without complications: Secondary | ICD-10-CM | POA: Insufficient documentation

## 2018-05-22 DIAGNOSIS — M8580 Other specified disorders of bone density and structure, unspecified site: Secondary | ICD-10-CM

## 2018-05-22 DIAGNOSIS — Z923 Personal history of irradiation: Secondary | ICD-10-CM | POA: Insufficient documentation

## 2018-05-22 DIAGNOSIS — E785 Hyperlipidemia, unspecified: Secondary | ICD-10-CM | POA: Diagnosis not present

## 2018-05-22 DIAGNOSIS — Z79899 Other long term (current) drug therapy: Secondary | ICD-10-CM | POA: Insufficient documentation

## 2018-05-22 DIAGNOSIS — Z853 Personal history of malignant neoplasm of breast: Secondary | ICD-10-CM | POA: Insufficient documentation

## 2018-05-22 DIAGNOSIS — Z7982 Long term (current) use of aspirin: Secondary | ICD-10-CM | POA: Insufficient documentation

## 2018-05-22 DIAGNOSIS — C50211 Malignant neoplasm of upper-inner quadrant of right female breast: Secondary | ICD-10-CM

## 2018-05-22 DIAGNOSIS — Z170 Estrogen receptor positive status [ER+]: Secondary | ICD-10-CM

## 2018-05-22 DIAGNOSIS — Z803 Family history of malignant neoplasm of breast: Secondary | ICD-10-CM | POA: Insufficient documentation

## 2018-05-22 DIAGNOSIS — M8588 Other specified disorders of bone density and structure, other site: Secondary | ICD-10-CM

## 2018-05-22 DIAGNOSIS — Z1239 Encounter for other screening for malignant neoplasm of breast: Secondary | ICD-10-CM

## 2018-05-22 DIAGNOSIS — Z9049 Acquired absence of other specified parts of digestive tract: Secondary | ICD-10-CM | POA: Diagnosis not present

## 2018-05-22 LAB — CMP (CANCER CENTER ONLY)
ALBUMIN: 3.9 g/dL (ref 3.5–5.0)
ALK PHOS: 73 U/L (ref 38–126)
ALT: 22 U/L (ref 0–44)
AST: 19 U/L (ref 15–41)
Anion gap: 9 (ref 5–15)
BUN: 22 mg/dL (ref 8–23)
CALCIUM: 9.5 mg/dL (ref 8.9–10.3)
CHLORIDE: 105 mmol/L (ref 98–111)
CO2: 24 mmol/L (ref 22–32)
Creatinine: 0.97 mg/dL (ref 0.44–1.00)
GFR, Estimated: 57 mL/min — ABNORMAL LOW (ref >60)
GLUCOSE: 97 mg/dL (ref 70–99)
POTASSIUM: 4.3 mmol/L (ref 3.5–5.1)
SODIUM: 138 mmol/L (ref 135–145)
Total Bilirubin: 0.5 mg/dL (ref 0.3–1.2)
Total Protein: 7.2 g/dL (ref 6.5–8.1)

## 2018-05-22 MED ORDER — DENOSUMAB 60 MG/ML ~~LOC~~ SOLN: 60.0000 mg | Freq: Once | SUBCUTANEOUS | Status: DC

## 2018-05-22 MED ORDER — DENOSUMAB 60 MG/ML ~~LOC~~ SOSY
60.0000 mg | PREFILLED_SYRINGE | Freq: Once | SUBCUTANEOUS | Status: AC
  Administered 2018-05-22: 60 mg via SUBCUTANEOUS

## 2018-05-22 MED ORDER — DENOSUMAB 60 MG/ML ~~LOC~~ SOSY
PREFILLED_SYRINGE | SUBCUTANEOUS | Status: AC
  Filled 2018-05-22: qty 1

## 2018-05-22 NOTE — Telephone Encounter (Signed)
Gave avs and calendar ° °

## 2018-05-22 NOTE — Patient Instructions (Signed)
Denosumab injection  What is this medicine?  DENOSUMAB (den oh sue mab) slows bone breakdown. Prolia is used to treat osteoporosis in women after menopause and in men. Xgeva is used to prevent bone fractures and other bone problems caused by cancer bone metastases. Xgeva is also used to treat giant cell tumor of the bone.  This medicine may be used for other purposes; ask your health care provider or pharmacist if you have questions.  What should I tell my health care provider before I take this medicine?  They need to know if you have any of these conditions:  -dental disease  -eczema  -infection or history of infections  -kidney disease or on dialysis  -low blood calcium or vitamin D  -malabsorption syndrome  -scheduled to have surgery or tooth extraction  -taking medicine that contains denosumab  -thyroid or parathyroid disease  -an unusual reaction to denosumab, other medicines, foods, dyes, or preservatives  -pregnant or trying to get pregnant  -breast-feeding  How should I use this medicine?  This medicine is for injection under the skin. It is given by a health care professional in a hospital or clinic setting.  If you are getting Prolia, a special MedGuide will be given to you by the pharmacist with each prescription and refill. Be sure to read this information carefully each time.  For Prolia, talk to your pediatrician regarding the use of this medicine in children. Special care may be needed. For Xgeva, talk to your pediatrician regarding the use of this medicine in children. While this drug may be prescribed for children as young as 13 years for selected conditions, precautions do apply.  Overdosage: If you think you have taken too much of this medicine contact a poison control center or emergency room at once.  NOTE: This medicine is only for you. Do not share this medicine with others.  What if I miss a dose?  It is important not to miss your dose. Call your doctor or health care professional if you are  unable to keep an appointment.  What may interact with this medicine?  Do not take this medicine with any of the following medications:  -other medicines containing denosumab  This medicine may also interact with the following medications:  -medicines that suppress the immune system  -medicines that treat cancer  -steroid medicines like prednisone or cortisone  This list may not describe all possible interactions. Give your health care provider a list of all the medicines, herbs, non-prescription drugs, or dietary supplements you use. Also tell them if you smoke, drink alcohol, or use illegal drugs. Some items may interact with your medicine.  What should I watch for while using this medicine?  Visit your doctor or health care professional for regular checks on your progress. Your doctor or health care professional may order blood tests and other tests to see how you are doing.  Call your doctor or health care professional if you get a cold or other infection while receiving this medicine. Do not treat yourself. This medicine may decrease your body's ability to fight infection.  You should make sure you get enough calcium and vitamin D while you are taking this medicine, unless your doctor tells you not to. Discuss the foods you eat and the vitamins you take with your health care professional.  See your dentist regularly. Brush and floss your teeth as directed. Before you have any dental work done, tell your dentist you are receiving this medicine.  Do   not become pregnant while taking this medicine or for 5 months after stopping it. Women should inform their doctor if they wish to become pregnant or think they might be pregnant. There is a potential for serious side effects to an unborn child. Talk to your health care professional or pharmacist for more information.  What side effects may I notice from receiving this medicine?  Side effects that you should report to your doctor or health care professional as soon as  possible:  -allergic reactions like skin rash, itching or hives, swelling of the face, lips, or tongue  -breathing problems  -chest pain  -fast, irregular heartbeat  -feeling faint or lightheaded, falls  -fever, chills, or any other sign of infection  -muscle spasms, tightening, or twitches  -numbness or tingling  -skin blisters or bumps, or is dry, peels, or red  -slow healing or unexplained pain in the mouth or jaw  -unusual bleeding or bruising  Side effects that usually do not require medical attention (Report these to your doctor or health care professional if they continue or are bothersome.):  -muscle pain  -stomach upset, gas  This list may not describe all possible side effects. Call your doctor for medical advice about side effects. You may report side effects to FDA at 1-800-FDA-1088.  Where should I keep my medicine?  This medicine is only given in a clinic, doctor's office, or other health care setting and will not be stored at home.  NOTE: This sheet is a summary. It may not cover all possible information. If you have questions about this medicine, talk to your doctor, pharmacist, or health care provider.      2016, [employer]. (2012-04-30 12:37:47)

## 2018-05-22 NOTE — Patient Instructions (Signed)
Bone Health Bones protect organs, store calcium, and anchor muscles. Good health habits, such as eating nutritious foods and exercising regularly, are important for maintaining healthy bones. They can also help to prevent a condition that causes bones to lose density and become weak and brittle (osteoporosis). Why is bone mass important? Bone mass refers to the amount of bone tissue that you have. The higher your bone mass, the stronger your bones. An important step toward having healthy bones throughout life is to have strong and dense bones during childhood. A young adult who has a high bone mass is more likely to have a high bone mass later in life. Bone mass at its greatest it is called peak bone mass. A large decline in bone mass occurs in older adults. In women, it occurs about the time of menopause. During this time, it is important to practice good health habits, because if more bone is lost than what is replaced, the bones will become less healthy and more likely to break (fracture). If you find that you have a low bone mass, you may be able to prevent osteoporosis or further bone loss by changing your diet and lifestyle. How can I find out if my bone mass is low? Bone mass can be measured with an X-ray test that is called a bone mineral density (BMD) test. This test is recommended for all women who are age 65 or older. It may also be recommended for men who are age 70 or older, or for people who are more likely to develop osteoporosis due to:  Having bones that break easily.  Having a long-term disease that weakens bones, such as kidney disease or rheumatoid arthritis.  Having menopause earlier than normal.  Taking medicine that weakens bones, such as steroids, thyroid hormones, or hormone treatment for breast cancer or prostate cancer.  Smoking.  Drinking three or more alcoholic drinks each day.  What are the nutritional recommendations for healthy bones? To have healthy bones, you  need to get enough of the right minerals and vitamins. Most nutrition experts recommend getting these nutrients from the foods that you eat. Nutritional recommendations vary from person to person. Ask your health care provider what is healthy for you. Here are some general guidelines. Calcium Recommendations Calcium is the most important (essential) mineral for bone health. Most people can get enough calcium from their diet, but supplements may be recommended for people who are at risk for osteoporosis. Good sources of calcium include:  Dairy products, such as low-fat or nonfat milk, cheese, and yogurt.  Dark green leafy vegetables, such as bok choy and broccoli.  Calcium-fortified foods, such as orange juice, cereal, bread, soy beverages, and tofu products.  Nuts, such as almonds.  Follow these recommended amounts for daily calcium intake:  Children, age 1?3: 700 mg.  Children, age 4?8: 1,000 mg.  Children, age 9?13: 1,300 mg.  Teens, age 14?18: 1,300 mg.  Adults, age 19?50: 1,000 mg.  Adults, age 51?70: ? Men: 1,000 mg. ? Women: 1,200 mg.  Adults, age 71 or older: 1,200 mg.  Pregnant and breastfeeding females: ? Teens: 1,300 mg. ? Adults: 1,000 mg.  Vitamin D Recommendations Vitamin D is the most essential vitamin for bone health. It helps the body to absorb calcium. Sunlight stimulates the skin to make vitamin D, so be sure to get enough sunlight. If you live in a cold climate or you do not get outside often, your health care provider may recommend that you take vitamin   D supplements. Good sources of vitamin D in your diet include:  Egg yolks.  Saltwater fish.  Milk and cereal fortified with vitamin D.  Follow these recommended amounts for daily vitamin D intake:  Children and teens, age 1?18: 600 international units.  Adults, age 50 or younger: 400-800 international units.  Adults, age 51 or older: 800-1,000 international units.  Other Nutrients Other nutrients  for bone health include:  Phosphorus. This mineral is found in meat, poultry, dairy foods, nuts, and legumes. The recommended daily intake for adult men and adult women is 700 mg.  Magnesium. This mineral is found in seeds, nuts, dark green vegetables, and legumes. The recommended daily intake for adult men is 400?420 mg. For adult women, it is 310?320 mg.  Vitamin K. This vitamin is found in green leafy vegetables. The recommended daily intake is 120 mg for adult men and 90 mg for adult women.  What type of physical activity is best for building and maintaining healthy bones? Weight-bearing and strength-building activities are important for building and maintaining peak bone mass. Weight-bearing activities cause muscles and bones to work against gravity. Strength-building activities increases muscle strength that supports bones. Weight-bearing and muscle-building activities include:  Walking and hiking.  Jogging and running.  Dancing.  Gym exercises.  Lifting weights.  Tennis and racquetball.  Climbing stairs.  Aerobics.  Adults should get at least 30 minutes of moderate physical activity on most days. Children should get at least 60 minutes of moderate physical activity on most days. Ask your health care provide what type of exercise is best for you. Where can I find more information? For more information, check out the following websites:  National Osteoporosis Foundation: http://nof.org/learn/basics  National Institutes of Health: http://www.niams.nih.gov/Health_Info/Bone/Bone_Health/bone_health_for_life.asp  This information is not intended to replace advice given to you by your health care provider. Make sure you discuss any questions you have with your health care provider. Document Released: 01/21/2004 Document Revised: 05/20/2016 Document Reviewed: 11/05/2014 Elsevier Interactive Patient Education  2018 [employer]  

## 2018-05-22 NOTE — Progress Notes (Signed)
CLINIC:  Survivorship   REASON FOR VISIT:  Routine follow-up for history of breast cancer.   BRIEF ONCOLOGIC HISTORY:    Breast cancer of upper-inner quadrant of right female breast ([place])   09/07/2011 Initial Diagnosis    Cancer of upper-inner quadrant of female breast      10/25/2011 Surgery    Right breast lumpectomy; 1.2 cm IDC No LVI; 3 SLN Neg; Er 100%, PR 100%; Her 2 Neg; Ki 67: 7%, T1CN0Mo (stage IA)      12/05/2011 - 12/30/2011 Radiation Therapy    Radiation to lumpectomy site      01/11/2012 - 12/23/2016 Anti-estrogen oral therapy    Arimidex 1 mg daily        INTERVAL HISTORY:  [name] presents to the [facility] today for routine follow-up for her history of breast cancer.  Overall, she reports feeling quite well.   Llewellyn sees Dr. Gerarda Fraction in [place] regularly.  She had her wellness visit yesterday.  She is unsure about her cancer screenings in regards to colon cancer and skin cancer.    Zarinah receives [employer] every 6 months since 2017.  She tolerates this well.  She sees her dentist at least annually, sometimes if she can fit it in her schedule she will see them every 6 months.    She does not get regular, intentional exercise.  She does coordinate estate sells.    REVIEW OF SYSTEMS:  Review of Systems  Constitutional: Negative for appetite change, chills, fatigue, fever and unexpected weight change.  HENT:   Negative for hearing loss, lump/mass, sore throat and voice change.   Eyes: Negative for eye problems and icterus.  Respiratory: Negative for chest tightness, cough and shortness of breath.   Cardiovascular: Negative for chest pain, leg swelling and palpitations.  Gastrointestinal: Negative for abdominal distention, abdominal pain, constipation, diarrhea, nausea and vomiting.  Endocrine: Negative for hot flashes.  Skin: Negative for itching and rash.  Neurological: Negative for dizziness, extremity weakness, headaches and numbness.    Hematological: Negative for adenopathy. Does not bruise/bleed easily.  Psychiatric/Behavioral: Negative for depression. The patient is not nervous/anxious.   Breast: Denies any new nodularity, masses, tenderness, nipple changes, or nipple discharge.       PAST MEDICAL/SURGICAL HISTORY:  Past Medical History:  Diagnosis Date  . Arthritis   . Breast cancer ([place])    INVASIVE MAMMARY DX - 08/30/11  . Cough   . Diabetes mellitus   . GERD (gastroesophageal reflux disease)   . Hyperlipidemia   . Personal history of chemotherapy   . S/P radiation therapy 12/05/11 - 12/30/11   Right Breast/Right Breast Boost  . Use of aromatase inhibitors 01/11/12   Arimidex - Dr. Marcy Panning   Past Surgical History:  Procedure Laterality Date  . BREAST BIOPSY  110/16/12     RIGHT BREAST NEEDLE CORE BIOPSPY, MASS 1 O'CLOCK - INVASIVE MAMARY  . BREAST LUMPECTOMY  10/25/11   RIGHT BREAST - INVASIVE DUCTAL, SENTINEL LYMPH BIOPSY,( 0/3) NODES NEGATIVE., ER+, PR+, LOW S-PHASE, HER 2 NEU - NO AMPLIFICATION  . CHOLECYSTECTOMY    . ERCP     CBD stone  . OOPHORECTOMY     with a cyst removal  . ovary removed    . stone in bile duct       ALLERGIES:  Allergies  Allergen Reactions  . Codeine Nausea And Vomiting     CURRENT MEDICATIONS:  Outpatient Encounter Medications as of 05/22/2018  Medication Sig Note  .  ALPRAZolam (XANAX) 0.5 MG tablet Take 0.5 mg by mouth at bedtime as needed.    Marland Kitchen aspirin 81 MG tablet Take 81 mg by mouth daily.     Marland Kitchen atorvastatin (LIPITOR) 40 MG tablet Take 40 mg by mouth daily. 03/19/2015: Received from: External Pharmacy  . celecoxib (CELEBREX) 100 MG capsule TAKE ONE CAPSULE BY MOUTH TWICE A DAY AS NEEDED   . gabapentin (NEURONTIN) 300 MG capsule Take 1 capsule (300 mg total) by mouth 2 (two) times daily. (Patient taking differently: Take 600 mg by mouth daily. )   . lisinopril (PRINIVIL,ZESTRIL) 2.5 MG tablet    . metFORMIN (GLUCOPHAGE-XR) 500 MG 24 hr tablet Take 500 mg by  mouth 2 (two) times daily.    . ranitidine (ZANTAC) 75 MG tablet Take 75 mg by mouth as needed for heartburn.   . traMADol (ULTRAM) 50 MG tablet  03/19/2015: Received from: External Pharmacy  . venlafaxine (EFFEXOR) 25 MG tablet TAKE 1 & 1/2 TABLETS BY MOUTH DAILY.   Marland Kitchen denosumab (PROLIA) 60 MG/ML injection     No facility-administered encounter medications on file as of 05/22/2018.      ONCOLOGIC FAMILY HISTORY:  Family History  Problem Relation Age of Onset  . Cancer Sister        breast  . Breast cancer Sister   . Heart failure Mother   . Cirrhosis Father       SOCIAL HISTORY:  Social History   Socioeconomic History  . Marital status: Married    Spouse name: Not on file  . Number of children: Not on file  . Years of education: Not on file  . Highest education level: Not on file  Occupational History  . Occupation: Armed forces operational officer  Social Needs  . Financial resource strain: Not on file  . Food insecurity:    Worry: Not on file    Inability: Not on file  . Transportation needs:    Medical: Not on file    Non-medical: Not on file  Tobacco Use  . Smoking status: Never Smoker  Substance and Sexual Activity  . Alcohol use: No  . Drug use: No  . Sexual activity: Yes    Birth control/protection: Post-menopausal    Comment: MENARCHE AGE 50/ G1, P1/ MENOPAUSE LATE 40'S/ HRT 12+ YRS - STOPPED 08/31/11  Lifestyle  . Physical activity:    Days per week: Not on file    Minutes per session: Not on file  . Stress: Not on file  Relationships  . Social connections:    Talks on phone: Not on file    Gets together: Not on file    Attends religious service: Not on file    Active member of club or organization: Not on file    Attends meetings of clubs or organizations: Not on file    Relationship status: Not on file  . Intimate partner violence:    Fear of current or ex partner: Not on file    Emotionally abused: Not on file    Physically abused: Not on file    Forced sexual  activity: Not on file  Other Topics Concern  . Not on file  Social History Narrative   Daughter- resides in [place]:  Vital Signs: Vitals:   05/22/18 1054  BP: 129/76  Pulse: 70  Resp: 18  Temp: 98.4 F (36.9 C)  SpO2: 100%   Filed Weights   05/22/18 1054  Weight: 155 lb (70.3  kg)   General: Well-nourished, well-appearing female in no acute distress.  Unaccompanied today.   HEENT: Head is normocephalic.  Pupils equal and reactive to light. Conjunctivae clear without exudate.  Sclerae anicteric. Oral mucosa is pink, moist.  Oropharynx is pink without lesions or erythema.  Lymph: No cervical, supraclavicular, or infraclavicular lymphadenopathy noted on palpation.  Cardiovascular: Regular rate and rhythm.Marland Kitchen Respiratory: Clear to auscultation bilaterally. Chest expansion symmetric; breathing non-labored.  Breast Exam:  -Left breast: No appreciable masses on palpation. No skin redness, thickening, or peau d'orange appearance; no nipple retraction or nipple discharge;  -Right breast: No appreciable masses on palpation. No skin redness, thickening, or peau d'orange appearance; no nipple retraction or nipple discharge; mild distortion in symmetry at previous lumpectomy site well healed scar without erythema or nodularity. -Axilla: No axillary adenopathy bilaterally.  GI: Abdomen soft and round; non-tender, non-distended. Bowel sounds normoactive. No hepatosplenomegaly.   GU: Deferred.  Neuro: No focal deficits. Steady gait.  Psych: Mood and affect normal and appropriate for situation.  MSK: No focal spinal tenderness to palpation, full range of motion in bilateral upper extremities Extremities: No edema. Skin: Warm and dry.  LABORATORY DATA:  Appointment on 05/22/2018  Component Date Value Ref Range Status  . Sodium 05/22/2018 138  135 - 145 mmol/L Final   Please note reference intervals were recently updated.  . Potassium 05/22/2018 4.3  3.5 - 5.1  mmol/L Final  . Chloride 05/22/2018 105  98 - 111 mmol/L Final  . CO2 05/22/2018 24  22 - 32 mmol/L Final  . Glucose, Bld 05/22/2018 97  70 - 99 mg/dL Final  . BUN 05/22/2018 22  8 - 23 mg/dL Final   Please note change in reference range.  . Creatinine 05/22/2018 0.97  0.44 - 1.00 mg/dL Final  . Calcium 05/22/2018 9.5  8.9 - 10.3 mg/dL Final  . Total Protein 05/22/2018 7.2  6.5 - 8.1 g/dL Final  . Albumin 05/22/2018 3.9  3.5 - 5.0 g/dL Final  . AST 05/22/2018 19  15 - 41 U/L Final  . ALT 05/22/2018 22  0 - 44 U/L Final  . Alkaline Phosphatase 05/22/2018 73  38 - 126 U/L Final  . Total Bilirubin 05/22/2018 0.5  0.3 - 1.2 mg/dL Final  . GFR, Est Non Af Am 05/22/2018 57* >60 mL/min Final  . GFR, Est AFR Am 05/22/2018 >60  >60 mL/min Final   Comment: (NOTE) The eGFR has been calculated using the CKD EPI equation. This calculation has not been validated in all clinical situations. eGFR's persistently <60 mL/min signify possible Chronic Kidney Disease.   Georgiann Hahn gap 05/22/2018 9  5 - 15 Final   Performed at [facility], [place] [address], [place], [place] [address]     DIAGNOSTIC IMAGING:  Most recent mammogram:     ASSESSMENT AND PLAN:  Ms.. Mendibles is a pleasant 72 y.o. female with history of Stage IA right breast invasive ductal carcinoma, ER+/PR+/HER2-, diagnosed in 08/2011, treated with lumpectomy, adjuvant radiation therapy, and anti-estrogen therapy with Anastrozole x 5 years completing therapy in 12/2016.  She presents to the [facility] for surveillance and routine follow-up.   1. History of breast cancer:  Ms. Piekarski is currently clinically and radiographically without evidence of disease or recurrence of breast cancer. She will be due for mammogram in 03/2019; orders placed today.  She will return in one year for labs and f/u.  I encouraged her to call me with any questions or  concerns before her next visit at the cancer center, and I would be  happy to see her sooner, if needed.    2. Bone health:  Given Ms. Marlatt's age, history of breast cancer, and her previous anti-estrogen therapy with Anastrozole, she is at risk for bone demineralization. She has osteopenia, with her last t score being -2.3 in the left femur.  She was placed on prolia while taking the Anastrozole since 2017.  She tolerates it well.  She is overdue for repeat bone density testing and I told her that she needs to have this done.  Once we have this data, we will be able to evaluate whether or not we can discontinue her prolia.  She was given education on specific food and activities to promote bone health.  3. Cancer screening:  Due to Ms. Blankley's history and her age, she should receive screening for skin cancers, colon cancer. She was encouraged to follow-up with her PCP for appropriate cancer screenings.   4. Health maintenance and wellness promotion: Ms. Cappiello was encouraged to consume 5-7 servings of fruits and vegetables per day. She was also encouraged to engage in moderate to vigorous exercise for 30 minutes per day most days of the week. She was instructed to limit her alcohol consumption and continue to abstain from tobacco use.     Dispo:  -Return to cancer center in 1 year for LTS follow up -Bone density ASAP -Prolia tentatively, depending on bone density results -Mammogram due 03/2019.   A total of (30) minutes of face-to-face time was spent with this patient with greater than 50% of that time in counseling and care-coordination.   Gardenia Phlegm, NP Survivorship Program [facility] [phone]   Note: PRIMARY CARE PROVIDER [employer], [place] [phone]

## 2018-06-22 DIAGNOSIS — E569 Vitamin deficiency, unspecified: Secondary | ICD-10-CM | POA: Diagnosis not present

## 2018-07-02 DIAGNOSIS — B029 Zoster without complications: Secondary | ICD-10-CM | POA: Diagnosis not present

## 2018-07-02 DIAGNOSIS — L03111 Cellulitis of right axilla: Secondary | ICD-10-CM | POA: Diagnosis not present

## 2018-07-24 DIAGNOSIS — E538 Deficiency of other specified B group vitamins: Secondary | ICD-10-CM | POA: Diagnosis not present

## 2018-07-25 ENCOUNTER — Ambulatory Visit
Admission: RE | Admit: 2018-07-25 | Discharge: 2018-07-25 | Disposition: A | Discharge status: Home / Self Care | Payer: Medicare Other | Source: Ambulatory Visit | Attending: Adult Health | Admitting: Adult Health

## 2018-07-25 DIAGNOSIS — M8588 Other specified disorders of bone density and structure, other site: Secondary | ICD-10-CM

## 2018-07-25 DIAGNOSIS — Z780 Asymptomatic menopausal state: Secondary | ICD-10-CM | POA: Diagnosis not present

## 2018-07-25 DIAGNOSIS — M8589 Other specified disorders of bone density and structure, multiple sites: Secondary | ICD-10-CM | POA: Diagnosis not present

## 2018-08-02 ENCOUNTER — Telehealth: Payer: Self-pay

## 2018-08-02 NOTE — Telephone Encounter (Signed)
Spoke with patient to inform of BD results. Explained T Score -2.2 and recommendations from NP including repeat BD in 2 years.  Patient voiced understanding.

## 2018-08-02 NOTE — Telephone Encounter (Signed)
-----   Message from Gardenia Phlegm, NP sent at 07/31/2018  5:53 PM EDT ----- Patient bone density is consistent with osteopenia with a t score of -2.2.  Would recommend calcium, vitamin d, and weight bearing exercises.  Would recommend repeating in 2 years.   ----- Message ----- From: Interface, Rad Results In Sent: 07/25/2018   2:52 PM EDT To: Gardenia Phlegm, NP

## 2018-08-14 DIAGNOSIS — E119 Type 2 diabetes mellitus without complications: Secondary | ICD-10-CM | POA: Diagnosis not present

## 2018-08-21 DIAGNOSIS — D510 Vitamin B12 deficiency anemia due to intrinsic factor deficiency: Secondary | ICD-10-CM | POA: Diagnosis not present

## 2018-09-21 DIAGNOSIS — D510 Vitamin B12 deficiency anemia due to intrinsic factor deficiency: Secondary | ICD-10-CM | POA: Diagnosis not present

## 2018-10-18 DIAGNOSIS — Z23 Encounter for immunization: Secondary | ICD-10-CM | POA: Diagnosis not present

## 2018-10-18 DIAGNOSIS — E538 Deficiency of other specified B group vitamins: Secondary | ICD-10-CM | POA: Diagnosis not present

## 2018-11-21 DIAGNOSIS — E538 Deficiency of other specified B group vitamins: Secondary | ICD-10-CM | POA: Diagnosis not present

## 2018-11-22 ENCOUNTER — Inpatient Hospital Stay: Payer: Medicare Other | Attending: Adult Health

## 2018-11-22 ENCOUNTER — Inpatient Hospital Stay: Payer: Medicare Other

## 2018-11-22 ENCOUNTER — Other Ambulatory Visit: Payer: Self-pay

## 2018-11-22 DIAGNOSIS — Z79899 Other long term (current) drug therapy: Secondary | ICD-10-CM | POA: Insufficient documentation

## 2018-11-22 DIAGNOSIS — Z170 Estrogen receptor positive status [ER+]: Secondary | ICD-10-CM

## 2018-11-22 DIAGNOSIS — C50211 Malignant neoplasm of upper-inner quadrant of right female breast: Secondary | ICD-10-CM

## 2018-11-22 DIAGNOSIS — M8580 Other specified disorders of bone density and structure, unspecified site: Secondary | ICD-10-CM | POA: Insufficient documentation

## 2018-11-22 LAB — CMP (CANCER CENTER ONLY)
ALT: 18 U/L (ref 0–44)
ANION GAP: 10 (ref 5–15)
AST: 19 U/L (ref 15–41)
Albumin: 3.9 g/dL (ref 3.5–5.0)
Alkaline Phosphatase: 69 U/L (ref 38–126)
BILIRUBIN TOTAL: 0.6 mg/dL (ref 0.3–1.2)
BUN: 17 mg/dL (ref 8–23)
CO2: 23 mmol/L (ref 22–32)
Calcium: 9.2 mg/dL (ref 8.9–10.3)
Chloride: 106 mmol/L (ref 98–111)
Creatinine: 0.94 mg/dL (ref 0.44–1.00)
GFR, Estimated: >60 mL/min (ref >60)
GLUCOSE: 102 mg/dL — AB (ref 70–99)
POTASSIUM: 4.4 mmol/L (ref 3.5–5.1)
Sodium: 139 mmol/L (ref 135–145)
Total Protein: 7 g/dL (ref 6.5–8.1)

## 2018-11-22 MED ORDER — DENOSUMAB 60 MG/ML ~~LOC~~ SOSY
PREFILLED_SYRINGE | SUBCUTANEOUS | Status: AC
  Filled 2018-11-22: qty 1

## 2018-11-22 MED ORDER — DENOSUMAB 60 MG/ML ~~LOC~~ SOSY
60.0000 mg | PREFILLED_SYRINGE | Freq: Once | SUBCUTANEOUS | Status: AC
  Administered 2018-11-22: 60 mg via SUBCUTANEOUS

## 2018-12-18 DIAGNOSIS — D510 Vitamin B12 deficiency anemia due to intrinsic factor deficiency: Secondary | ICD-10-CM | POA: Diagnosis not present

## 2019-01-16 DIAGNOSIS — E538 Deficiency of other specified B group vitamins: Secondary | ICD-10-CM | POA: Diagnosis not present

## 2019-02-15 DIAGNOSIS — D510 Vitamin B12 deficiency anemia due to intrinsic factor deficiency: Secondary | ICD-10-CM | POA: Diagnosis not present

## 2019-03-06 DIAGNOSIS — G894 Chronic pain syndrome: Secondary | ICD-10-CM | POA: Diagnosis not present

## 2019-03-06 DIAGNOSIS — Z1389 Encounter for screening for other disorder: Secondary | ICD-10-CM | POA: Diagnosis not present

## 2019-03-06 DIAGNOSIS — E1140 Type 2 diabetes mellitus with diabetic neuropathy, unspecified: Secondary | ICD-10-CM | POA: Diagnosis not present

## 2019-03-25 DIAGNOSIS — E1140 Type 2 diabetes mellitus with diabetic neuropathy, unspecified: Secondary | ICD-10-CM | POA: Diagnosis not present

## 2019-03-25 DIAGNOSIS — Z1389 Encounter for screening for other disorder: Secondary | ICD-10-CM | POA: Diagnosis not present

## 2019-03-25 DIAGNOSIS — I10 Essential (primary) hypertension: Secondary | ICD-10-CM | POA: Diagnosis not present

## 2019-03-25 DIAGNOSIS — Z0001 Encounter for general adult medical examination with abnormal findings: Secondary | ICD-10-CM | POA: Diagnosis not present

## 2019-03-25 DIAGNOSIS — E7849 Other hyperlipidemia: Secondary | ICD-10-CM | POA: Diagnosis not present

## 2019-03-25 DIAGNOSIS — E538 Deficiency of other specified B group vitamins: Secondary | ICD-10-CM | POA: Diagnosis not present

## 2019-04-25 DIAGNOSIS — D510 Vitamin B12 deficiency anemia due to intrinsic factor deficiency: Secondary | ICD-10-CM | POA: Diagnosis not present

## 2019-05-03 ENCOUNTER — Telehealth: Payer: Self-pay | Admitting: Adult Health

## 2019-05-03 NOTE — Telephone Encounter (Signed)
I left a message regarding visit °

## 2019-05-13 ENCOUNTER — Other Ambulatory Visit: Payer: Self-pay

## 2019-05-13 ENCOUNTER — Ambulatory Visit
Admission: RE | Admit: 2019-05-13 | Discharge: 2019-05-13 | Disposition: A | Discharge status: Home / Self Care | Payer: Medicare Other | Source: Ambulatory Visit | Attending: Adult Health | Admitting: Adult Health

## 2019-05-13 DIAGNOSIS — Z1231 Encounter for screening mammogram for malignant neoplasm of breast: Secondary | ICD-10-CM | POA: Diagnosis not present

## 2019-05-13 DIAGNOSIS — Z1239 Encounter for other screening for malignant neoplasm of breast: Secondary | ICD-10-CM

## 2019-05-15 ENCOUNTER — Other Ambulatory Visit: Payer: Self-pay | Admitting: Adult Health

## 2019-05-15 DIAGNOSIS — R928 Other abnormal and inconclusive findings on diagnostic imaging of breast: Secondary | ICD-10-CM

## 2019-05-15 HISTORY — PX: BREAST BIOPSY: SHX20

## 2019-05-16 ENCOUNTER — Ambulatory Visit
Admission: RE | Admit: 2019-05-16 | Discharge: 2019-05-16 | Disposition: A | Discharge status: Home / Self Care | Payer: Medicare Other | Source: Ambulatory Visit | Attending: Adult Health | Admitting: Adult Health

## 2019-05-16 ENCOUNTER — Other Ambulatory Visit: Payer: Self-pay

## 2019-05-16 ENCOUNTER — Other Ambulatory Visit: Payer: Self-pay | Admitting: Adult Health

## 2019-05-16 DIAGNOSIS — N6310 Unspecified lump in the right breast, unspecified quadrant: Secondary | ICD-10-CM

## 2019-05-16 DIAGNOSIS — N6311 Unspecified lump in the right breast, upper outer quadrant: Secondary | ICD-10-CM | POA: Diagnosis not present

## 2019-05-16 DIAGNOSIS — R928 Other abnormal and inconclusive findings on diagnostic imaging of breast: Secondary | ICD-10-CM

## 2019-05-16 DIAGNOSIS — R922 Inconclusive mammogram: Secondary | ICD-10-CM | POA: Diagnosis not present

## 2019-05-21 DIAGNOSIS — D510 Vitamin B12 deficiency anemia due to intrinsic factor deficiency: Secondary | ICD-10-CM | POA: Diagnosis not present

## 2019-05-23 ENCOUNTER — Ambulatory Visit
Admission: RE | Admit: 2019-05-23 | Discharge: 2019-05-23 | Disposition: A | Discharge status: Home / Self Care | Payer: Medicare Other | Source: Ambulatory Visit | Attending: Adult Health | Admitting: Adult Health

## 2019-05-23 ENCOUNTER — Inpatient Hospital Stay: Payer: Medicare Other | Admitting: Adult Health

## 2019-05-23 DIAGNOSIS — N6311 Unspecified lump in the right breast, upper outer quadrant: Secondary | ICD-10-CM | POA: Diagnosis not present

## 2019-05-23 DIAGNOSIS — N6310 Unspecified lump in the right breast, unspecified quadrant: Secondary | ICD-10-CM

## 2019-05-23 DIAGNOSIS — N641 Fat necrosis of breast: Secondary | ICD-10-CM | POA: Diagnosis not present

## 2019-06-17 ENCOUNTER — Telehealth: Payer: Self-pay

## 2019-06-17 NOTE — Telephone Encounter (Signed)
RN returned called, voicemail left for patient to call back.

## 2019-06-19 ENCOUNTER — Telehealth: Payer: Self-pay

## 2019-06-19 ENCOUNTER — Telehealth: Payer: Self-pay | Admitting: Hematology and Oncology

## 2019-06-19 NOTE — Telephone Encounter (Signed)
Scheduled appt per 8/05 sch message - pt aware of appt date and time   

## 2019-06-19 NOTE — Telephone Encounter (Signed)
MD reviewed need for Prolia based on bone density scan results. Per MD, would like patient to continue Prolia Q 6 months.  Pt notified.  Scheduling message sent.

## 2019-06-20 DIAGNOSIS — D510 Vitamin B12 deficiency anemia due to intrinsic factor deficiency: Secondary | ICD-10-CM | POA: Diagnosis not present

## 2019-06-21 ENCOUNTER — Other Ambulatory Visit: Payer: Self-pay | Admitting: Hematology and Oncology

## 2019-06-24 ENCOUNTER — Telehealth: Payer: Self-pay | Admitting: Adult Health

## 2019-06-24 ENCOUNTER — Inpatient Hospital Stay: Payer: Medicare Other | Attending: Adult Health

## 2019-06-24 ENCOUNTER — Other Ambulatory Visit: Payer: Self-pay | Admitting: *Deleted

## 2019-06-24 ENCOUNTER — Inpatient Hospital Stay: Payer: Medicare Other

## 2019-06-24 ENCOUNTER — Other Ambulatory Visit: Payer: Self-pay

## 2019-06-24 VITALS — BP 122/72 | HR 72 | Temp 98.5°F | Resp 18

## 2019-06-24 DIAGNOSIS — Z79899 Other long term (current) drug therapy: Secondary | ICD-10-CM | POA: Insufficient documentation

## 2019-06-24 DIAGNOSIS — M8580 Other specified disorders of bone density and structure, unspecified site: Secondary | ICD-10-CM | POA: Diagnosis not present

## 2019-06-24 DIAGNOSIS — C50211 Malignant neoplasm of upper-inner quadrant of right female breast: Secondary | ICD-10-CM

## 2019-06-24 LAB — CMP (CANCER CENTER ONLY)
ALT: 16 U/L (ref 0–44)
AST: 19 U/L (ref 15–41)
Albumin: 4.1 g/dL (ref 3.5–5.0)
Alkaline Phosphatase: 63 U/L (ref 38–126)
Anion gap: 13 (ref 5–15)
BUN: 24 mg/dL — ABNORMAL HIGH (ref 8–23)
CO2: 23 mmol/L (ref 22–32)
Calcium: 9.8 mg/dL (ref 8.9–10.3)
Chloride: 103 mmol/L (ref 98–111)
Creatinine: 1.22 mg/dL — ABNORMAL HIGH (ref 0.44–1.00)
GFR, Est AFR Am: 51 mL/min — ABNORMAL LOW (ref >60)
GFR, Estimated: 44 mL/min — ABNORMAL LOW (ref >60)
Glucose, Bld: 97 mg/dL (ref 70–99)
Potassium: 4.4 mmol/L (ref 3.5–5.1)
Sodium: 139 mmol/L (ref 135–145)
Total Bilirubin: 0.6 mg/dL (ref 0.3–1.2)
Total Protein: 7.2 g/dL (ref 6.5–8.1)

## 2019-06-24 MED ORDER — DENOSUMAB 60 MG/ML ~~LOC~~ SOSY
PREFILLED_SYRINGE | SUBCUTANEOUS | Status: AC
  Filled 2019-06-24: qty 1

## 2019-06-24 MED ORDER — DENOSUMAB 60 MG/ML ~~LOC~~ SOSY
60.0000 mg | PREFILLED_SYRINGE | Freq: Once | SUBCUTANEOUS | Status: AC
  Administered 2019-06-24: 60 mg via SUBCUTANEOUS

## 2019-06-24 NOTE — Patient Instructions (Signed)
Denosumab injection  What is this medicine?  DENOSUMAB (den oh sue mab) slows bone breakdown. Prolia is used to treat osteoporosis in women after menopause and in men. Xgeva is used to prevent bone fractures and other bone problems caused by cancer bone metastases. Xgeva is also used to treat giant cell tumor of the bone.  This medicine may be used for other purposes; ask your health care provider or pharmacist if you have questions.  What should I tell my health care provider before I take this medicine?  They need to know if you have any of these conditions:  -dental disease  -eczema  -infection or history of infections  -kidney disease or on dialysis  -low blood calcium or vitamin D  -malabsorption syndrome  -scheduled to have surgery or tooth extraction  -taking medicine that contains denosumab  -thyroid or parathyroid disease  -an unusual reaction to denosumab, other medicines, foods, dyes, or preservatives  -pregnant or trying to get pregnant  -breast-feeding  How should I use this medicine?  This medicine is for injection under the skin. It is given by a health care professional in a hospital or clinic setting.  If you are getting Prolia, a special MedGuide will be given to you by the pharmacist with each prescription and refill. Be sure to read this information carefully each time.  For Prolia, talk to your pediatrician regarding the use of this medicine in children. Special care may be needed. For Xgeva, talk to your pediatrician regarding the use of this medicine in children. While this drug may be prescribed for children as young as 13 years for selected conditions, precautions do apply.  Overdosage: If you think you have taken too much of this medicine contact a poison control center or emergency room at once.  NOTE: This medicine is only for you. Do not share this medicine with others.  What if I miss a dose?  It is important not to miss your dose. Call your doctor or health care professional if you are  unable to keep an appointment.  What may interact with this medicine?  Do not take this medicine with any of the following medications:  -other medicines containing denosumab  This medicine may also interact with the following medications:  -medicines that suppress the immune system  -medicines that treat cancer  -steroid medicines like prednisone or cortisone  This list may not describe all possible interactions. Give your health care provider a list of all the medicines, herbs, non-prescription drugs, or dietary supplements you use. Also tell them if you smoke, drink alcohol, or use illegal drugs. Some items may interact with your medicine.  What should I watch for while using this medicine?  Visit your doctor or health care professional for regular checks on your progress. Your doctor or health care professional may order blood tests and other tests to see how you are doing.  Call your doctor or health care professional if you get a cold or other infection while receiving this medicine. Do not treat yourself. This medicine may decrease your body's ability to fight infection.  You should make sure you get enough calcium and vitamin D while you are taking this medicine, unless your doctor tells you not to. Discuss the foods you eat and the vitamins you take with your health care professional.  See your dentist regularly. Brush and floss your teeth as directed. Before you have any dental work done, tell your dentist you are receiving this medicine.  Do   not become pregnant while taking this medicine or for 5 months after stopping it. Women should inform their doctor if they wish to become pregnant or think they might be pregnant. There is a potential for serious side effects to an unborn child. Talk to your health care professional or pharmacist for more information.  What side effects may I notice from receiving this medicine?  Side effects that you should report to your doctor or health care professional as soon as  possible:  -allergic reactions like skin rash, itching or hives, swelling of the face, lips, or tongue  -breathing problems  -chest pain  -fast, irregular heartbeat  -feeling faint or lightheaded, falls  -fever, chills, or any other sign of infection  -muscle spasms, tightening, or twitches  -numbness or tingling  -skin blisters or bumps, or is dry, peels, or red  -slow healing or unexplained pain in the mouth or jaw  -unusual bleeding or bruising  Side effects that usually do not require medical attention (Report these to your doctor or health care professional if they continue or are bothersome.):  -muscle pain  -stomach upset, gas  This list may not describe all possible side effects. Call your doctor for medical advice about side effects. You may report side effects to FDA at 1-800-FDA-1088.  Where should I keep my medicine?  This medicine is only given in a clinic, doctor's office, or other health care setting and will not be stored at home.  NOTE: This sheet is a summary. It may not cover all possible information. If you have questions about this medicine, talk to your doctor, pharmacist, or health care provider.      2016, [employer]. (2012-04-30 12:37:47)

## 2019-06-24 NOTE — Telephone Encounter (Signed)
Scheduled appt per 8/10 sch message - unable to reach pt . Mailed letter with appt date and time after leaving message on vmail.

## 2019-06-24 NOTE — Progress Notes (Signed)
Pt returned today for Prolia injection. Ok to give injection per Dr Lindi Adie and will see in 6 months.

## 2019-07-26 DIAGNOSIS — D510 Vitamin B12 deficiency anemia due to intrinsic factor deficiency: Secondary | ICD-10-CM | POA: Diagnosis not present

## 2019-08-14 ENCOUNTER — Other Ambulatory Visit: Payer: Self-pay | Admitting: Adult Health

## 2019-08-14 DIAGNOSIS — N63 Unspecified lump in unspecified breast: Secondary | ICD-10-CM

## 2019-08-20 DIAGNOSIS — D510 Vitamin B12 deficiency anemia due to intrinsic factor deficiency: Secondary | ICD-10-CM | POA: Diagnosis not present

## 2019-09-27 DIAGNOSIS — E538 Deficiency of other specified B group vitamins: Secondary | ICD-10-CM | POA: Diagnosis not present

## 2019-10-17 ENCOUNTER — Other Ambulatory Visit: Payer: Self-pay

## 2019-10-17 DIAGNOSIS — Z20822 Contact with and (suspected) exposure to covid-19: Secondary | ICD-10-CM

## 2019-10-18 LAB — NOVEL CORONAVIRUS, NAA: SARS-CoV-2, NAA: DETECTED — AB

## 2019-10-19 ENCOUNTER — Telehealth: Payer: Self-pay | Admitting: Nurse Practitioner

## 2019-10-19 NOTE — Telephone Encounter (Signed)
Attempt to call to discuss with patient about Covid symptoms and the use of bamlanivimab, a monoclonal antibody infusion for those with mild to moderate Covid symptoms and at a high risk of hospitalization.  Pt may be qualified for this infusion at the [facility] infusion center if symptomatic.  General HIPAA compliant message left with patient.

## 2019-11-25 ENCOUNTER — Ambulatory Visit
Admission: RE | Admit: 2019-11-25 | Discharge: 2019-11-25 | Disposition: A | Discharge status: Home / Self Care | Payer: Medicare Other | Source: Ambulatory Visit | Attending: Adult Health | Admitting: Adult Health

## 2019-11-25 ENCOUNTER — Other Ambulatory Visit: Payer: Self-pay

## 2019-11-25 DIAGNOSIS — R922 Inconclusive mammogram: Secondary | ICD-10-CM | POA: Diagnosis not present

## 2019-11-25 DIAGNOSIS — N63 Unspecified lump in unspecified breast: Secondary | ICD-10-CM

## 2019-11-25 DIAGNOSIS — N6311 Unspecified lump in the right breast, upper outer quadrant: Secondary | ICD-10-CM | POA: Diagnosis not present

## 2019-12-15 DIAGNOSIS — I10 Essential (primary) hypertension: Secondary | ICD-10-CM | POA: Diagnosis not present

## 2019-12-15 DIAGNOSIS — E7849 Other hyperlipidemia: Secondary | ICD-10-CM | POA: Diagnosis not present

## 2019-12-15 DIAGNOSIS — E1140 Type 2 diabetes mellitus with diabetic neuropathy, unspecified: Secondary | ICD-10-CM | POA: Diagnosis not present

## 2019-12-17 DIAGNOSIS — E538 Deficiency of other specified B group vitamins: Secondary | ICD-10-CM | POA: Diagnosis not present

## 2019-12-18 ENCOUNTER — Other Ambulatory Visit: Payer: Self-pay | Admitting: Hematology and Oncology

## 2019-12-18 NOTE — Progress Notes (Signed)
Patient insurance does not cover Prolia.  It covers Zometa.  We will set her up for every 64-month Zometa infusions with labs.

## 2019-12-20 ENCOUNTER — Telehealth: Payer: Self-pay | Admitting: Adult Health

## 2019-12-20 ENCOUNTER — Other Ambulatory Visit: Payer: Self-pay

## 2019-12-20 DIAGNOSIS — C50211 Malignant neoplasm of upper-inner quadrant of right female breast: Secondary | ICD-10-CM

## 2019-12-20 NOTE — Telephone Encounter (Signed)
I left a message regarding 8/9 zometa

## 2019-12-23 ENCOUNTER — Encounter: Payer: Self-pay | Admitting: Adult Health

## 2019-12-23 ENCOUNTER — Inpatient Hospital Stay: Payer: Medicare HMO

## 2019-12-23 ENCOUNTER — Inpatient Hospital Stay: Payer: Medicare HMO | Attending: Adult Health | Admitting: Adult Health

## 2019-12-23 ENCOUNTER — Other Ambulatory Visit: Payer: Self-pay

## 2019-12-23 VITALS — BP 121/70 | HR 77 | Temp 98.7°F | Resp 18 | Ht 64.0 in | Wt 149.3 lb

## 2019-12-23 DIAGNOSIS — Z923 Personal history of irradiation: Secondary | ICD-10-CM | POA: Insufficient documentation

## 2019-12-23 DIAGNOSIS — C50211 Malignant neoplasm of upper-inner quadrant of right female breast: Secondary | ICD-10-CM

## 2019-12-23 DIAGNOSIS — Z9221 Personal history of antineoplastic chemotherapy: Secondary | ICD-10-CM | POA: Diagnosis not present

## 2019-12-23 DIAGNOSIS — Z803 Family history of malignant neoplasm of breast: Secondary | ICD-10-CM | POA: Insufficient documentation

## 2019-12-23 DIAGNOSIS — Z853 Personal history of malignant neoplasm of breast: Secondary | ICD-10-CM | POA: Diagnosis not present

## 2019-12-23 DIAGNOSIS — Z7982 Long term (current) use of aspirin: Secondary | ICD-10-CM | POA: Insufficient documentation

## 2019-12-23 DIAGNOSIS — M129 Arthropathy, unspecified: Secondary | ICD-10-CM | POA: Insufficient documentation

## 2019-12-23 DIAGNOSIS — E785 Hyperlipidemia, unspecified: Secondary | ICD-10-CM | POA: Diagnosis not present

## 2019-12-23 DIAGNOSIS — Z79899 Other long term (current) drug therapy: Secondary | ICD-10-CM | POA: Insufficient documentation

## 2019-12-23 DIAGNOSIS — E119 Type 2 diabetes mellitus without complications: Secondary | ICD-10-CM | POA: Insufficient documentation

## 2019-12-23 DIAGNOSIS — M8580 Other specified disorders of bone density and structure, unspecified site: Secondary | ICD-10-CM | POA: Diagnosis not present

## 2019-12-23 DIAGNOSIS — Z7984 Long term (current) use of oral hypoglycemic drugs: Secondary | ICD-10-CM | POA: Diagnosis not present

## 2019-12-23 DIAGNOSIS — K219 Gastro-esophageal reflux disease without esophagitis: Secondary | ICD-10-CM | POA: Diagnosis not present

## 2019-12-23 DIAGNOSIS — Z170 Estrogen receptor positive status [ER+]: Secondary | ICD-10-CM

## 2019-12-23 LAB — CBC WITH DIFFERENTIAL (CANCER CENTER ONLY)
Abs Immature Granulocytes: 0.02 10*3/uL (ref 0.00–0.07)
Basophils Absolute: 0 10*3/uL (ref 0.0–0.1)
Basophils Relative: 1 %
Eosinophils Absolute: 0.1 10*3/uL (ref 0.0–0.5)
Eosinophils Relative: 1 %
HCT: 38.5 % (ref 36.0–46.0)
Hemoglobin: 12.7 g/dL (ref 12.0–15.0)
Immature Granulocytes: 0 %
Lymphocytes Relative: 36 %
Lymphs Abs: 2.1 10*3/uL (ref 0.7–4.0)
MCH: 29.2 pg (ref 26.0–34.0)
MCHC: 33 g/dL (ref 30.0–36.0)
MCV: 88.5 fL (ref 80.0–100.0)
Monocytes Absolute: 0.5 10*3/uL (ref 0.1–1.0)
Monocytes Relative: 8 %
Neutro Abs: 3.1 10*3/uL (ref 1.7–7.7)
Neutrophils Relative %: 54 %
Platelet Count: 216 10*3/uL (ref 150–400)
RBC: 4.35 MIL/uL (ref 3.87–5.11)
RDW: 14.2 % (ref 11.5–15.5)
WBC Count: 5.8 10*3/uL (ref 4.0–10.5)
nRBC: 0 % (ref 0.0–0.2)

## 2019-12-23 LAB — CMP (CANCER CENTER ONLY)
ALT: 19 U/L (ref 0–44)
AST: 17 U/L (ref 15–41)
Albumin: 4.1 g/dL (ref 3.5–5.0)
Alkaline Phosphatase: 65 U/L (ref 38–126)
Anion gap: 10 (ref 5–15)
BUN: 23 mg/dL (ref 8–23)
CO2: 26 mmol/L (ref 22–32)
Calcium: 9.3 mg/dL (ref 8.9–10.3)
Chloride: 104 mmol/L (ref 98–111)
Creatinine: 0.99 mg/dL (ref 0.44–1.00)
GFR, Est AFR Am: >60 mL/min (ref >60)
GFR, Estimated: 57 mL/min — ABNORMAL LOW (ref >60)
Glucose, Bld: 91 mg/dL (ref 70–99)
Potassium: 4.5 mmol/L (ref 3.5–5.1)
Sodium: 140 mmol/L (ref 135–145)
Total Bilirubin: 0.5 mg/dL (ref 0.3–1.2)
Total Protein: 7.4 g/dL (ref 6.5–8.1)

## 2019-12-23 MED ORDER — ZOLEDRONIC ACID 4 MG/100ML IV SOLN
INTRAVENOUS | Status: AC
  Filled 2019-12-23: qty 100

## 2019-12-23 MED ORDER — SODIUM CHLORIDE 0.9 % IV SOLN
Freq: Once | INTRAVENOUS | Status: AC
  Filled 2019-12-23: qty 250

## 2019-12-23 MED ORDER — ZOLEDRONIC ACID 4 MG/100ML IV SOLN
4.0000 mg | Freq: Once | INTRAVENOUS | Status: AC
  Administered 2019-12-23: 4 mg via INTRAVENOUS

## 2019-12-23 NOTE — Patient Instructions (Signed)
Bone Health Bones protect organs, store calcium, anchor muscles, and support the whole body. Keeping your bones strong is important, especially as you get older. You can take actions to help keep your bones strong and healthy. Why is keeping my bones healthy important?  Keeping your bones healthy is important because your body constantly replaces bone cells. Cells get old, and new cells take their place. As we age, we lose bone cells because the body may not be able to make enough new cells to replace the old cells. The amount of bone cells and bone tissue you have is referred to as bone mass. The higher your bone mass, the stronger your bones. The aging process leads to an overall loss of bone mass in the body, which can increase the likelihood of:  Joint pain and stiffness.  Broken bones.  A condition in which the bones become weak and brittle (osteoporosis). A large decline in bone mass occurs in older adults. In women, it occurs about the time of menopause. What actions can I take to keep my bones healthy? Good health habits are important for maintaining healthy bones. This includes eating nutritious foods and exercising regularly. To have healthy bones, you need to get enough of the right minerals and vitamins. Most nutrition experts recommend getting these nutrients from the foods that you eat. In some cases, taking supplements may also be recommended. Doing certain types of exercise is also important for bone health. What are the nutritional recommendations for healthy bones?  Eating a well-balanced diet with plenty of calcium and vitamin D will help to protect your bones. Nutritional recommendations vary from person to person. Ask your health care provider what is healthy for you. Here are some general guidelines. Get enough calcium Calcium is the most important (essential) mineral for bone health. Most people can get enough calcium from their diet, but supplements may be recommended for  people who are at risk for osteoporosis. Good sources of calcium include:  Dairy products, such as low-fat or nonfat milk, cheese, and yogurt.  Dark green leafy vegetables, such as bok choy and broccoli.  Calcium-fortified foods, such as orange juice, cereal, bread, soy beverages, and tofu products.  Nuts, such as almonds. Follow these recommended amounts for daily calcium intake:  Children, age 1-3: 700 mg.  Children, age 4-8: 1,000 mg.  Children, age 9-13: 1,300 mg.  Teens, age 14-18: 1,300 mg.  Adults, age 19-50: 1,000 mg.  Adults, age 51-70: ? Men: 1,000 mg. ? Women: 1,200 mg.  Adults, age 71 or older: 1,200 mg.  Pregnant and breastfeeding females: ? Teens: 1,300 mg. ? Adults: 1,000 mg. Get enough vitamin D Vitamin D is the most essential vitamin for bone health. It helps the body absorb calcium. Sunlight stimulates the skin to make vitamin D, so be sure to get enough sunlight. If you live in a cold climate or you do not get outside often, your health care provider may recommend that you take vitamin D supplements. Good sources of vitamin D in your diet include:  Egg yolks.  Saltwater fish.  Milk and cereal fortified with vitamin D. Follow these recommended amounts for daily vitamin D intake:  Children and teens, age 1-18: 600 international units.  Adults, age 50 or younger: 400-800 international units.  Adults, age 51 or older: 800-1,000 international units. Get other important nutrients Other nutrients that are important for bone health include:  Phosphorus. This mineral is found in meat, poultry, dairy foods, nuts, and legumes. The   recommended daily intake for adult men and adult women is 700 mg.  Magnesium. This mineral is found in seeds, nuts, dark green vegetables, and legumes. The recommended daily intake for adult men is 400-420 mg. For adult women, it is 310-320 mg.  Vitamin K. This vitamin is found in green leafy vegetables. The recommended daily  intake is 120 mg for adult men and 90 mg for adult women. What type of physical activity is best for building and maintaining healthy bones? Weight-bearing and strength-building activities are important for building and maintaining healthy bones. Weight-bearing activities cause muscles and bones to work against gravity. Strength-building activities increase the strength of the muscles that support bones. Weight-bearing and muscle-building activities include:  Walking and hiking.  Jogging and running.  Dancing.  Gym exercises.  Lifting weights.  Tennis and racquetball.  Climbing stairs.  Aerobics. Adults should get at least 30 minutes of moderate physical activity on most days. Children should get at least 60 minutes of moderate physical activity on most days. Ask your health care provider what type of exercise is best for you. How can I find out if my bone mass is low? Bone mass can be measured with an X-ray test called a bone mineral density (BMD) test. This test is recommended for all women who are age 43 or older. It may also be recommended for:  Men who are age 30 or older.  People who are at risk for osteoporosis because of: ? Having bones that break easily. ? Having a long-term disease that weakens bones, such as kidney disease or rheumatoid arthritis. ? Having menopause earlier than normal. ? Taking medicine that weakens bones, such as steroids, thyroid hormones, or hormone treatment for breast cancer or prostate cancer. ? Smoking. ? Drinking three or more alcoholic drinks a day. If you find that you have a low bone mass, you may be able to prevent osteoporosis or further bone loss by changing your diet and lifestyle. Where can I find more information? For more information, check out the following websites:  [place]: [employer]fr  [employer]: www.bones.SouthExposed.es  International Osteoporosis Foundation:  Administrator.iofbonehealth.org Summary  The aging process leads to an overall loss of bone mass in the body, which can increase the likelihood of broken bones and osteoporosis.  Eating a well-balanced diet with plenty of calcium and vitamin D will help to protect your bones.  Weight-bearing and strength-building activities are also important for building and maintaining strong bones.  Bone mass can be measured with an X-ray test called a bone mineral density (BMD) test. This information is not intended to replace advice given to you by your health care provider. Make sure you discuss any questions you have with your health care provider. Document Revised: 11/27/2017 Document Reviewed: 11/27/2017 Elsevier Patient Education  [place]. Zoledronic Acid injection (Hypercalcemia, Oncology) What is this medicine? ZOLEDRONIC ACID (ZOE le dron ik AS id) lowers the amount of calcium loss from bone. It is used to treat too much calcium in your blood from cancer. It is also used to prevent complications of cancer that has spread to the bone. This medicine may be used for other purposes; ask your health care provider or pharmacist if you have questions. COMMON BRAND NAME(S): Zometa What should I tell my health care provider before I take this medicine? They need to know if you have any of these conditions:  aspirin-sensitive asthma  cancer, especially if you are receiving medicines used to treat cancer  dental disease or wear dentures  infection  kidney disease  receiving corticosteroids like dexamethasone or prednisone  an unusual or allergic reaction to zoledronic acid, other medicines, foods, dyes, or preservatives  pregnant or trying to get pregnant  breast-feeding How should I use this medicine? This medicine is for infusion into a vein. It is given by a health care professional in a hospital or clinic setting. Talk to your pediatrician regarding the use of this medicine in children.  Special care may be needed. Overdosage: If you think you have taken too much of this medicine contact a poison control center or emergency room at once. NOTE: This medicine is only for you. Do not share this medicine with others. What if I miss a dose? It is important not to miss your dose. Call your doctor or health care professional if you are unable to keep an appointment. What may interact with this medicine?  certain antibiotics given by injection  NSAIDs, medicines for pain and inflammation, like ibuprofen or naproxen  some diuretics like bumetanide, furosemide  teriparatide  thalidomide This list may not describe all possible interactions. Give your health care provider a list of all the medicines, herbs, non-prescription drugs, or dietary supplements you use. Also tell them if you smoke, drink alcohol, or use illegal drugs. Some items may interact with your medicine. What should I watch for while using this medicine? Visit your doctor or health care professional for regular checkups. It may be some time before you see the benefit from this medicine. Do not stop taking your medicine unless your doctor tells you to. Your doctor may order blood tests or other tests to see how you are doing. Women should inform their doctor if they wish to become pregnant or think they might be pregnant. There is a potential for serious side effects to an unborn child. Talk to your health care professional or pharmacist for more information. You should make sure that you get enough calcium and vitamin D while you are taking this medicine. Discuss the foods you eat and the vitamins you take with your health care professional. Some people who take this medicine have severe bone, joint, and/or muscle pain. This medicine may also increase your risk for jaw problems or a broken thigh bone. Tell your doctor right away if you have severe pain in your jaw, bones, joints, or muscles. Tell your doctor if you have any  pain that does not go away or that gets worse. Tell your dentist and dental surgeon that you are taking this medicine. You should not have major dental surgery while on this medicine. See your dentist to have a dental exam and fix any dental problems before starting this medicine. Take good care of your teeth while on this medicine. Make sure you see your dentist for regular follow-up appointments. What side effects may I notice from receiving this medicine? Side effects that you should report to your doctor or health care professional as soon as possible:  allergic reactions like skin rash, itching or hives, swelling of the face, lips, or tongue  anxiety, confusion, or depression  breathing problems  changes in vision  eye pain  feeling faint or lightheaded, falls  jaw pain, especially after dental work  mouth sores  muscle cramps, stiffness, or weakness  redness, blistering, peeling or loosening of the skin, including inside the mouth  trouble passing urine or change in the amount of urine Side effects that usually do not require medical attention (report  to your doctor or health care professional if they continue or are bothersome):  bone, joint, or muscle pain  constipation  diarrhea  fever  hair loss  irritation at site where injected  loss of appetite  nausea, vomiting  stomach upset  trouble sleeping  trouble swallowing  weak or tired This list may not describe all possible side effects. Call your doctor for medical advice about side effects. You may report side effects to FDA at 1-800-FDA-1088. Where should I keep my medicine? This drug is given in a hospital or clinic and will not be stored at home. NOTE: This sheet is a summary. It may not cover all possible information. If you have questions about this medicine, talk to your doctor, pharmacist, or health care provider.  2020 Elsevier/Gold Standard (2014-03-29 14:19:39)

## 2019-12-23 NOTE — Progress Notes (Signed)
CLINIC:  Survivorship   REASON FOR VISIT:  Routine follow-up for history of breast cancer.   BRIEF ONCOLOGIC HISTORY:  Oncology History  Breast cancer of upper-inner quadrant of right female breast ([place])  09/07/2011 Initial Diagnosis   Cancer of upper-inner quadrant of female breast   10/25/2011 Surgery   Right breast lumpectomy; 1.2 cm IDC No LVI; 3 SLN Neg; Er 100%, PR 100%; Her 2 Neg; Ki 67: 7%, T1CN0Mo (stage IA)   12/05/2011 - 12/30/2011 Radiation Therapy   Radiation to lumpectomy site   01/11/2012 - 12/23/2016 Anti-estrogen oral therapy   Arimidex 1 mg daily      INTERVAL HISTORY:  Ms. Metzger presents to the [facility] today for routine follow-up for her history of breast cancer.  Overall, she reports feeling quite well.   Lakeyshia is doing well today.  She continues to have regular f/u with her PCP.  Her most recent mammogram on 11/25/2019 was negative for cancer, and she did have an area of fat necrosis that remained stable.    Ranesha has a well balanced diet full of fruits and vegetables.  She notes that she isn't the best at exercising.  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  Jelena has osteopenia, and previously was taking Anastrozole daily x 5 years.  She completed this therapy in 12/2016, however continued on Prolia.  Her most recent bone density in 2019 showed no change in her bone density.  She is due for repeat bone density in September of this year.  Today she will receive Zometa instead of Prolia due to insurance issues.    REVIEW OF SYSTEMS:  Review of Systems  Constitutional: Negative for appetite change, chills, fatigue, fever and unexpected weight change.  HENT:   Negative for hearing loss, lump/mass, sore throat and voice change.   Eyes: Negative for eye problems and icterus.  Respiratory: Negative for chest tightness, cough and shortness of breath.   Cardiovascular: Negative for chest pain, leg swelling and palpitations.  Gastrointestinal: Negative  for abdominal distention, abdominal pain, constipation, diarrhea, nausea and vomiting.  Endocrine: Negative for hot flashes.  Skin: Negative for itching and rash.  Neurological: Negative for dizziness, extremity weakness, headaches and numbness.  Hematological: Negative for adenopathy. Does not bruise/bleed easily.  Psychiatric/Behavioral: Negative for depression. The patient is not nervous/anxious.   Breast: Denies any new nodularity, masses, tenderness, nipple changes, or nipple discharge.       PAST MEDICAL/SURGICAL HISTORY:  Past Medical History:  Diagnosis Date  . Arthritis   . Breast cancer ([place])    INVASIVE MAMMARY DX - 08/30/11  . Cough   . Diabetes mellitus   . GERD (gastroesophageal reflux disease)   . Hyperlipidemia   . Personal history of chemotherapy   . S/P radiation therapy 12/05/11 - 12/30/11   Right Breast/Right Breast Boost  . Use of aromatase inhibitors 01/11/12   Arimidex - Dr. Marcy Panning   Past Surgical History:  Procedure Laterality Date  . BREAST BIOPSY  110/16/12     RIGHT BREAST NEEDLE CORE BIOPSPY, MASS 1 O'CLOCK - INVASIVE MAMARY  . BREAST LUMPECTOMY  10/25/11   RIGHT BREAST - INVASIVE DUCTAL, SENTINEL LYMPH BIOPSY,( 0/3) NODES NEGATIVE., ER+, PR+, LOW S-PHASE, HER 2 NEU - NO AMPLIFICATION  . CHOLECYSTECTOMY    . ERCP     CBD stone  . OOPHORECTOMY     with a cyst removal  . ovary removed    . stone in bile duct  ALLERGIES:  Allergies  Allergen Reactions  . Codeine Nausea And Vomiting     CURRENT MEDICATIONS:  Outpatient Encounter Medications as of 12/23/2019  Medication Sig Note  . ALPRAZolam (XANAX) 0.5 MG tablet Take 0.5 mg by mouth at bedtime as needed.    . Ascorbic Acid (VITAMIN C PO) Take by mouth daily.   Marland Kitchen aspirin 81 MG tablet Take 81 mg by mouth daily.     Marland Kitchen atorvastatin (LIPITOR) 40 MG tablet Take 40 mg by mouth daily. 03/19/2015: Received from: External Pharmacy  . cyanocobalamin (,VITAMIN B-12,) 1000 MCG/ML injection  Inject 1,000 mcg into the muscle every 30 (thirty) days.   Marland Kitchen denosumab (PROLIA) 60 MG/ML SOSY injection Inject 60 mg into the skin every 6 (six) months.   . gabapentin (NEURONTIN) 300 MG capsule Take 1 capsule (300 mg total) by mouth 2 (two) times daily. (Patient taking differently: Take 600 mg by mouth daily. )   . lisinopril (PRINIVIL,ZESTRIL) 2.5 MG tablet    . metFORMIN (GLUCOPHAGE-XR) 500 MG 24 hr tablet Take 500 mg by mouth 2 (two) times daily.    . Multiple Vitamins-Minerals (ZINC PO) Take by mouth daily.   . ranitidine (ZANTAC) 75 MG tablet Take 75 mg by mouth as needed for heartburn.   . venlafaxine (EFFEXOR) 25 MG tablet TAKE 1 & 1/2 TABLETS BY MOUTH DAILY.   Marland Kitchen VITAMIN D PO Take by mouth daily.   . [DISCONTINUED] celecoxib (CELEBREX) 100 MG capsule TAKE ONE CAPSULE BY MOUTH TWICE A DAY AS NEEDED   . [DISCONTINUED] traMADol (ULTRAM) 50 MG tablet  03/19/2015: Received from: External Pharmacy   No facility-administered encounter medications on file as of 12/23/2019.     ONCOLOGIC FAMILY HISTORY:  Family History  Problem Relation Age of Onset  . Cancer Sister        breast  . Breast cancer Sister   . Heart failure Mother   . Cirrhosis Father       SOCIAL HISTORY:  Social History   Socioeconomic History  . Marital status: Married    Spouse name: Not on file  . Number of children: Not on file  . Years of education: Not on file  . Highest education level: Not on file  Occupational History  . Occupation: Armed forces operational officer  Tobacco Use  . Smoking status: Never Smoker  Substance and Sexual Activity  . Alcohol use: No  . Drug use: No  . Sexual activity: Yes    Birth control/protection: Post-menopausal    Comment: MENARCHE AGE 64/ G1, P1/ MENOPAUSE LATE 40'S/ HRT 12+ YRS - STOPPED 08/31/11  Other Topics Concern  . Not on file  Social History Narrative   Daughter- resides in [place]   Social Determinants of Health   Financial Resource Strain:   . Difficulty of Paying  Living Expenses: Not on file  Food Insecurity:   . Worried About Charity fundraiser in the Last Year: Not on file  . Ran Out of Food in the Last Year: Not on file  Transportation Needs:   . Lack of Transportation (Medical): Not on file  . Lack of Transportation (Non-Medical): Not on file  Physical Activity:   . Days of Exercise per Week: Not on file  . Minutes of Exercise per Session: Not on file  Stress:   . Feeling of Stress : Not on file  Social Connections:   . Frequency of Communication with Friends and Family: Not on file  . Frequency of Social Gatherings with  Friends and Family: Not on file  . Attends Religious Services: Not on file  . Active Member of Clubs or Organizations: Not on file  . Attends Archivist Meetings: Not on file  . Marital Status: Not on file  Intimate Partner Violence:   . Fear of Current or Ex-Partner: Not on file  . Emotionally Abused: Not on file  . Physically Abused: Not on file  . Sexually Abused: Not on file      PHYSICAL EXAMINATION:  Vital Signs: Vitals:   12/23/19 1019  BP: 121/70  Pulse: 77  Resp: 18  Temp: 98.7 F (37.1 C)  SpO2: 99%   Filed Weights   12/23/19 1019  Weight: 149 lb 4.8 oz (67.7 kg)   General: Well-nourished, well-appearing female in no acute distress.  Unaccompanied today.   HEENT: Head is normocephalic.  Pupils equal and reactive to light. Conjunctivae clear without exudate.  Sclerae anicteric. Oral mucosa is pink, moist.  Oropharynx is pink without lesions or erythema.  Lymph: No cervical, supraclavicular, or infraclavicular lymphadenopathy noted on palpation.  Cardiovascular: Regular rate and rhythm.Marland Kitchen Respiratory: Clear to auscultation bilaterally. Chest expansion symmetric; breathing non-labored.  Breast Exam:  -Left breast: No appreciable masses on palpation. No skin redness, thickening, or peau d'orange appearance; no nipple retraction or nipple discharge;  -Right breast: No appreciable masses on  palpation. No skin redness, thickening, or peau d'orange appearance; no nipple retraction or nipple discharge; mild distortion in symmetry at previous lumpectomy site well healed scar without erythema or nodularity. -Axilla: No axillary adenopathy bilaterally.  GI: Abdomen soft and round; non-tender, non-distended. Bowel sounds normoactive. No hepatosplenomegaly.   GU: Deferred.  Neuro: No focal deficits. Steady gait.  Psych: Mood and affect normal and appropriate for situation.  MSK: No focal spinal tenderness to palpation, full range of motion in bilateral upper extremities Extremities: No edema. Skin: Warm and dry.  LABORATORY DATA:  Appointment on 12/23/2019  Component Date Value Ref Range Status  . WBC Count 12/23/2019 5.8  4.0 - 10.5 K/uL Final  . RBC 12/23/2019 4.35  3.87 - 5.11 MIL/uL Final  . Hemoglobin 12/23/2019 12.7  12.0 - 15.0 g/dL Final  . HCT 12/23/2019 38.5  36.0 - 46.0 % Final  . MCV 12/23/2019 88.5  80.0 - 100.0 fL Final  . MCH 12/23/2019 29.2  26.0 - 34.0 pg Final  . MCHC 12/23/2019 33.0  30.0 - 36.0 g/dL Final  . RDW 12/23/2019 14.2  11.5 - 15.5 % Final  . Platelet Count 12/23/2019 216  150 - 400 K/uL Final  . nRBC 12/23/2019 0.0  0.0 - 0.2 % Final  . Neutrophils Relative % 12/23/2019 54  % Final  . Neutro Abs 12/23/2019 3.1  1.7 - 7.7 K/uL Final  . Lymphocytes Relative 12/23/2019 36  % Final  . Lymphs Abs 12/23/2019 2.1  0.7 - 4.0 K/uL Final  . Monocytes Relative 12/23/2019 8  % Final  . Monocytes Absolute 12/23/2019 0.5  0.1 - 1.0 K/uL Final  . Eosinophils Relative 12/23/2019 1  % Final  . Eosinophils Absolute 12/23/2019 0.1  0.0 - 0.5 K/uL Final  . Basophils Relative 12/23/2019 1  % Final  . Basophils Absolute 12/23/2019 0.0  0.0 - 0.1 K/uL Final  . Immature Granulocytes 12/23/2019 0  % Final  . Abs Immature Granulocytes 12/23/2019 0.02  0.00 - 0.07 K/uL Final   Performed at [facility], [place] [address] [place], [place] [address]  DIAGNOSTIC IMAGING:  Most recent mammogram:  EXAM: DIGITAL DIAGNOSTIC RIGHT MAMMOGRAM WITH CAD AND TOMO  ULTRASOUND RIGHT BREAST  COMPARISON:  Previous exam(s).  ACR Breast Density Category c: The breast tissue is heterogeneously dense, which may obscure small masses.  FINDINGS: There are no significant interval changes at the biopsy site in the upper outer quadrant of the right breast. No suspicious calcifications, masses or areas of distortion are seen in the right breast.  Mammographic images were processed with CAD.  Ultrasound targeted to the right breast at 11 o'clock, 3 cm from the nipple demonstrates a stable irregular mass with indistinct margins measuring 4 x 3 x 3 mm, previously 5 x 3 x 4 mm. The biopsy marking clip is seen well positioned within the mass.  IMPRESSION: No interval changes in the biopsy-proven site of fat necrosis in the right breast at 11 o'clock.  RECOMMENDATION: Return to routine screening mammography is recommended. The patient will be due for screening in June of 2021.  I have discussed the findings and recommendations with the patient. If applicable, a reminder letter will be sent to the patient regarding the next appointment.  BI-RADS CATEGORY  2: Benign.   Electronically Signed   By: Ammie Ferrier M.D.   On: 11/25/2019 11:14    ASSESSMENT AND PLAN:  Ms.. Prest is a pleasant 74 y.o. female with history of Stage IA right breast invasive ductal carcinoma, ER+/PR+/HER2-, diagnosed in 08/2011, treated with lumpectomy, adjuvant radiation therapy, and anti-estrogen therapy with Anastrozole x 5 years completing therapy in 12/2016.  [facility] and routine follow-up.   1. History of breast cancer:  Ms. Cuthbert is currently clinically and radiographically without evidence of disease or recurrence of breast cancer.  She will be due for her next mammogram in 11/2020.  We  will see her back in 12/2020 for continued surveillance and monitoring. I encouraged her to call me with any questions or concerns before her next visit at the cancer center, and I would be happy to see her sooner, if needed.    2. Bone health:  Given Ms. Farney's age, history of breast cancer, and her previous anti-estrogen therapy with Anastrozole, she is at risk for bone demineralization. She is transitioning from Prolia to Zometa.  She will receive this every 6  months, and we will repeat bone density in 07/2020.  We will decide at her next visit in one year if we need to continue bisphosphanate therapy or not.  I reviewed that the change was secondary to insurance, however recent studies suggest patients receiving alternative bisphosphanate therapy after stopping prolia, instead of stopping Prolia abruptly will prevent rebound bone resorption because this is theoretically related to fractures.  She notes it has been one year since her last dental visit due to the pandemic, however she denies any current dental issues or concerns.  We reviewed the risks and benefits of Zometa including bone aches, low risk of osteonecrosis of the jaw, and hypocalcemia.  I also gave her detailed information in her AVS about Zometa along with bone health.  .  3. Cancer screening:  Due to Ms. Waibel's history and her age, she should receive screening for skin cancers, colon cancer. She was encouraged to follow-up with her PCP for appropriate cancer screenings.   4. Health maintenance and wellness promotion: Ms. Mandeville was encouraged to consume 5-7 servings of fruits and vegetables per day. She was also encouraged to engage in moderate to vigorous  exercise for 30 minutes per day most days of the week. She was instructed to limit her alcohol consumption and continue to abstain from tobacco use.     Dispo:  -Return to cancer center in 1 year for LTS follow up -Bone density 07/2020 -Mammogram due 11/2019  Total time  this appointment: 30 minutes   Gardenia Phlegm, [place] [phone]   Note: PRIMARY CARE PROVIDER [employer], [place] [phone]

## 2019-12-23 NOTE — Progress Notes (Signed)
Per Mendel Ryder, NP ok to start treatment with Zometa without dental exam clearance.

## 2019-12-23 NOTE — Patient Instructions (Signed)
Bone Health Bones protect organs, store calcium, anchor muscles, and support the whole body. Keeping your bones strong is important, especially as you get older. You can take actions to help keep your bones strong and healthy. Why is keeping my bones healthy important?  Keeping your bones healthy is important because your body constantly replaces bone cells. Cells get old, and new cells take their place. As we age, we lose bone cells because the body may not be able to make enough new cells to replace the old cells. The amount of bone cells and bone tissue you have is referred to as bone mass. The higher your bone mass, the stronger your bones. The aging process leads to an overall loss of bone mass in the body, which can increase the likelihood of:  Joint pain and stiffness.  Broken bones.  A condition in which the bones become weak and brittle (osteoporosis). A large decline in bone mass occurs in older adults. In women, it occurs about the time of menopause. What actions can I take to keep my bones healthy? Good health habits are important for maintaining healthy bones. This includes eating nutritious foods and exercising regularly. To have healthy bones, you need to get enough of the right minerals and vitamins. Most nutrition experts recommend getting these nutrients from the foods that you eat. In some cases, taking supplements may also be recommended. Doing certain types of exercise is also important for bone health. What are the nutritional recommendations for healthy bones?  Eating a well-balanced diet with plenty of calcium and vitamin D will help to protect your bones. Nutritional recommendations vary from person to person. Ask your health care provider what is healthy for you. Here are some general guidelines. Get enough calcium Calcium is the most important (essential) mineral for bone health. Most people can get enough calcium from their diet, but supplements may be recommended for  people who are at risk for osteoporosis. Good sources of calcium include:  Dairy products, such as low-fat or nonfat milk, cheese, and yogurt.  Dark green leafy vegetables, such as bok choy and broccoli.  Calcium-fortified foods, such as orange juice, cereal, bread, soy beverages, and tofu products.  Nuts, such as almonds. Follow these recommended amounts for daily calcium intake:  Children, age 1-3: 700 mg.  Children, age 4-8: 1,000 mg.  Children, age 9-13: 1,300 mg.  Teens, age 14-18: 1,300 mg.  Adults, age 19-50: 1,000 mg.  Adults, age 51-70: ? Men: 1,000 mg. ? Women: 1,200 mg.  Adults, age 71 or older: 1,200 mg.  Pregnant and breastfeeding females: ? Teens: 1,300 mg. ? Adults: 1,000 mg. Get enough vitamin D Vitamin D is the most essential vitamin for bone health. It helps the body absorb calcium. Sunlight stimulates the skin to make vitamin D, so be sure to get enough sunlight. If you live in a cold climate or you do not get outside often, your health care provider may recommend that you take vitamin D supplements. Good sources of vitamin D in your diet include:  Egg yolks.  Saltwater fish.  Milk and cereal fortified with vitamin D. Follow these recommended amounts for daily vitamin D intake:  Children and teens, age 1-18: 600 international units.  Adults, age 50 or younger: 400-800 international units.  Adults, age 51 or older: 800-1,000 international units. Get other important nutrients Other nutrients that are important for bone health include:  Phosphorus. This mineral is found in meat, poultry, dairy foods, nuts, and legumes. The   recommended daily intake for adult men and adult women is 700 mg.  Magnesium. This mineral is found in seeds, nuts, dark green vegetables, and legumes. The recommended daily intake for adult men is 400-420 mg. For adult women, it is 310-320 mg.  Vitamin K. This vitamin is found in green leafy vegetables. The recommended daily  intake is 120 mg for adult men and 90 mg for adult women. What type of physical activity is best for building and maintaining healthy bones? Weight-bearing and strength-building activities are important for building and maintaining healthy bones. Weight-bearing activities cause muscles and bones to work against gravity. Strength-building activities increase the strength of the muscles that support bones. Weight-bearing and muscle-building activities include:  Walking and hiking.  Jogging and running.  Dancing.  Gym exercises.  Lifting weights.  Tennis and racquetball.  Climbing stairs.  Aerobics. Adults should get at least 30 minutes of moderate physical activity on most days. Children should get at least 60 minutes of moderate physical activity on most days. Ask your health care provider what type of exercise is best for you. How can I find out if my bone mass is low? Bone mass can be measured with an X-ray test called a bone mineral density (BMD) test. This test is recommended for all women who are age 10 or older. It may also be recommended for:  Men who are age 68 or older.  People who are at risk for osteoporosis because of: ? Having bones that break easily. ? Having a long-term disease that weakens bones, such as kidney disease or rheumatoid arthritis. ? Having menopause earlier than normal. ? Taking medicine that weakens bones, such as steroids, thyroid hormones, or hormone treatment for breast cancer or prostate cancer. ? Smoking. ? Drinking three or more alcoholic drinks a day. If you find that you have a low bone mass, you may be able to prevent osteoporosis or further bone loss by changing your diet and lifestyle. Where can I find more information? For more information, check out the following websites:  [employer]: AviationTales.fr  [employer] of Health: www.bones.SouthExposed.es  International Osteoporosis Foundation:  Administrator.iofbonehealth.org Summary  The aging process leads to an overall loss of bone mass in the body, which can increase the likelihood of broken bones and osteoporosis.  Eating a well-balanced diet with plenty of calcium and vitamin D will help to protect your bones.  Weight-bearing and strength-building activities are also important for building and maintaining strong bones.  Bone mass can be measured with an X-ray test called a bone mineral density (BMD) test. This information is not intended to replace advice given to you by your health care provider. Make sure you discuss any questions you have with your health care provider. Document Revised: 11/27/2017 Document Reviewed: 11/27/2017 Elsevier Patient Education  [place]. Zoledronic Acid injection (Hypercalcemia, Oncology) What is this medicine? ZOLEDRONIC ACID (ZOE le dron ik AS id) lowers the amount of calcium loss from bone. It is used to treat too much calcium in your blood from cancer. It is also used to prevent complications of cancer that has spread to the bone. This medicine may be used for other purposes; ask your health care provider or pharmacist if you have questions. COMMON BRAND NAME(S): Zometa What should I tell my health care provider before I take this medicine? They need to know if you have any of these conditions:  aspirin-sensitive asthma  cancer, especially if you are receiving medicines used to treat cancer  dental disease or wear dentures  infection  kidney disease  receiving corticosteroids like dexamethasone or prednisone  an unusual or allergic reaction to zoledronic acid, other medicines, foods, dyes, or preservatives  pregnant or trying to get pregnant  breast-feeding How should I use this medicine? This medicine is for infusion into a vein. It is given by a health care professional in a hospital or clinic setting. Talk to your pediatrician regarding the use of this medicine in children.  Special care may be needed. Overdosage: If you think you have taken too much of this medicine contact a poison control center or emergency room at once. NOTE: This medicine is only for you. Do not share this medicine with others. What if I miss a dose? It is important not to miss your dose. Call your doctor or health care professional if you are unable to keep an appointment. What may interact with this medicine?  certain antibiotics given by injection  NSAIDs, medicines for pain and inflammation, like ibuprofen or naproxen  some diuretics like bumetanide, furosemide  teriparatide  thalidomide This list may not describe all possible interactions. Give your health care provider a list of all the medicines, herbs, non-prescription drugs, or dietary supplements you use. Also tell them if you smoke, drink alcohol, or use illegal drugs. Some items may interact with your medicine. What should I watch for while using this medicine? Visit your doctor or health care professional for regular checkups. It may be some time before you see the benefit from this medicine. Do not stop taking your medicine unless your doctor tells you to. Your doctor may order blood tests or other tests to see how you are doing. Women should inform their doctor if they wish to become pregnant or think they might be pregnant. There is a potential for serious side effects to an unborn child. Talk to your health care professional or pharmacist for more information. You should make sure that you get enough calcium and vitamin D while you are taking this medicine. Discuss the foods you eat and the vitamins you take with your health care professional. Some people who take this medicine have severe bone, joint, and/or muscle pain. This medicine may also increase your risk for jaw problems or a broken thigh bone. Tell your doctor right away if you have severe pain in your jaw, bones, joints, or muscles. Tell your doctor if you have any  pain that does not go away or that gets worse. Tell your dentist and dental surgeon that you are taking this medicine. You should not have major dental surgery while on this medicine. See your dentist to have a dental exam and fix any dental problems before starting this medicine. Take good care of your teeth while on this medicine. Make sure you see your dentist for regular follow-up appointments. What side effects may I notice from receiving this medicine? Side effects that you should report to your doctor or health care professional as soon as possible:  allergic reactions like skin rash, itching or hives, swelling of the face, lips, or tongue  anxiety, confusion, or depression  breathing problems  changes in vision  eye pain  feeling faint or lightheaded, falls  jaw pain, especially after dental work  mouth sores  muscle cramps, stiffness, or weakness  redness, blistering, peeling or loosening of the skin, including inside the mouth  trouble passing urine or change in the amount of urine Side effects that usually do not require medical attention (report  to your doctor or health care professional if they continue or are bothersome):  bone, joint, or muscle pain  constipation  diarrhea  fever  hair loss  irritation at site where injected  loss of appetite  nausea, vomiting  stomach upset  trouble sleeping  trouble swallowing  weak or tired This list may not describe all possible side effects. Call your doctor for medical advice about side effects. You may report side effects to FDA at 1-800-FDA-1088. Where should I keep my medicine? This drug is given in a hospital or clinic and will not be stored at home. NOTE: This sheet is a summary. It may not cover all possible information. If you have questions about this medicine, talk to your doctor, pharmacist, or health care provider.  2020 Elsevier/Gold Standard (2014-03-29 14:19:39)

## 2019-12-24 ENCOUNTER — Telehealth: Payer: Self-pay | Admitting: Adult Health

## 2019-12-24 NOTE — Telephone Encounter (Signed)
I left a message regarding schedule  

## 2020-01-12 DIAGNOSIS — E1140 Type 2 diabetes mellitus with diabetic neuropathy, unspecified: Secondary | ICD-10-CM | POA: Diagnosis not present

## 2020-01-12 DIAGNOSIS — I10 Essential (primary) hypertension: Secondary | ICD-10-CM | POA: Diagnosis not present

## 2020-01-12 DIAGNOSIS — E7849 Other hyperlipidemia: Secondary | ICD-10-CM | POA: Diagnosis not present

## 2020-01-24 DIAGNOSIS — E538 Deficiency of other specified B group vitamins: Secondary | ICD-10-CM | POA: Diagnosis not present

## 2020-02-12 DIAGNOSIS — E1140 Type 2 diabetes mellitus with diabetic neuropathy, unspecified: Secondary | ICD-10-CM | POA: Diagnosis not present

## 2020-02-12 DIAGNOSIS — I10 Essential (primary) hypertension: Secondary | ICD-10-CM | POA: Diagnosis not present

## 2020-02-12 DIAGNOSIS — E7849 Other hyperlipidemia: Secondary | ICD-10-CM | POA: Diagnosis not present

## 2020-02-25 DIAGNOSIS — E538 Deficiency of other specified B group vitamins: Secondary | ICD-10-CM | POA: Diagnosis not present

## 2020-03-13 DIAGNOSIS — E1140 Type 2 diabetes mellitus with diabetic neuropathy, unspecified: Secondary | ICD-10-CM | POA: Diagnosis not present

## 2020-03-13 DIAGNOSIS — E7849 Other hyperlipidemia: Secondary | ICD-10-CM | POA: Diagnosis not present

## 2020-03-13 DIAGNOSIS — I10 Essential (primary) hypertension: Secondary | ICD-10-CM | POA: Diagnosis not present

## 2020-03-23 DIAGNOSIS — D510 Vitamin B12 deficiency anemia due to intrinsic factor deficiency: Secondary | ICD-10-CM | POA: Diagnosis not present

## 2020-04-13 DIAGNOSIS — I10 Essential (primary) hypertension: Secondary | ICD-10-CM | POA: Diagnosis not present

## 2020-04-13 DIAGNOSIS — E7849 Other hyperlipidemia: Secondary | ICD-10-CM | POA: Diagnosis not present

## 2020-04-13 DIAGNOSIS — E1140 Type 2 diabetes mellitus with diabetic neuropathy, unspecified: Secondary | ICD-10-CM | POA: Diagnosis not present

## 2020-04-14 DIAGNOSIS — Z1389 Encounter for screening for other disorder: Secondary | ICD-10-CM | POA: Diagnosis not present

## 2020-04-14 DIAGNOSIS — Z6825 Body mass index (BMI) 25.0-25.9, adult: Secondary | ICD-10-CM | POA: Diagnosis not present

## 2020-04-14 DIAGNOSIS — I10 Essential (primary) hypertension: Secondary | ICD-10-CM | POA: Diagnosis not present

## 2020-04-14 DIAGNOSIS — E538 Deficiency of other specified B group vitamins: Secondary | ICD-10-CM | POA: Diagnosis not present

## 2020-04-14 DIAGNOSIS — Z0000 Encounter for general adult medical examination without abnormal findings: Secondary | ICD-10-CM | POA: Diagnosis not present

## 2020-04-14 DIAGNOSIS — E7849 Other hyperlipidemia: Secondary | ICD-10-CM | POA: Diagnosis not present

## 2020-04-14 DIAGNOSIS — M545 Low back pain: Secondary | ICD-10-CM | POA: Diagnosis not present

## 2020-04-14 DIAGNOSIS — E663 Overweight: Secondary | ICD-10-CM | POA: Diagnosis not present

## 2020-04-14 DIAGNOSIS — E119 Type 2 diabetes mellitus without complications: Secondary | ICD-10-CM | POA: Diagnosis not present

## 2020-04-14 DIAGNOSIS — M779 Enthesopathy, unspecified: Secondary | ICD-10-CM | POA: Diagnosis not present

## 2020-04-15 ENCOUNTER — Other Ambulatory Visit (HOSPITAL_COMMUNITY): Payer: Self-pay | Admitting: Internal Medicine

## 2020-04-15 DIAGNOSIS — Z1389 Encounter for screening for other disorder: Secondary | ICD-10-CM | POA: Diagnosis not present

## 2020-04-15 DIAGNOSIS — M545 Low back pain, unspecified: Secondary | ICD-10-CM

## 2020-04-15 DIAGNOSIS — Z0000 Encounter for general adult medical examination without abnormal findings: Secondary | ICD-10-CM | POA: Diagnosis not present

## 2020-04-15 DIAGNOSIS — Z6825 Body mass index (BMI) 25.0-25.9, adult: Secondary | ICD-10-CM | POA: Diagnosis not present

## 2020-04-15 DIAGNOSIS — E663 Overweight: Secondary | ICD-10-CM | POA: Diagnosis not present

## 2020-04-20 ENCOUNTER — Ambulatory Visit (HOSPITAL_COMMUNITY)
Admission: RE | Admit: 2020-04-20 | Discharge: 2020-04-20 | Disposition: A | Discharge status: Home / Self Care | Payer: Medicare HMO | Source: Ambulatory Visit | Attending: Internal Medicine | Admitting: Internal Medicine

## 2020-04-20 ENCOUNTER — Other Ambulatory Visit: Payer: Self-pay

## 2020-04-20 DIAGNOSIS — M5136 Other intervertebral disc degeneration, lumbar region: Secondary | ICD-10-CM | POA: Diagnosis not present

## 2020-04-20 DIAGNOSIS — M545 Low back pain, unspecified: Secondary | ICD-10-CM

## 2020-04-22 ENCOUNTER — Other Ambulatory Visit: Payer: Self-pay | Admitting: Internal Medicine

## 2020-04-22 DIAGNOSIS — Z1231 Encounter for screening mammogram for malignant neoplasm of breast: Secondary | ICD-10-CM

## 2020-05-13 DIAGNOSIS — I10 Essential (primary) hypertension: Secondary | ICD-10-CM | POA: Diagnosis not present

## 2020-05-13 DIAGNOSIS — E7849 Other hyperlipidemia: Secondary | ICD-10-CM | POA: Diagnosis not present

## 2020-05-13 DIAGNOSIS — E1140 Type 2 diabetes mellitus with diabetic neuropathy, unspecified: Secondary | ICD-10-CM | POA: Diagnosis not present

## 2020-05-15 DIAGNOSIS — E538 Deficiency of other specified B group vitamins: Secondary | ICD-10-CM | POA: Diagnosis not present

## 2020-05-19 ENCOUNTER — Other Ambulatory Visit: Payer: Self-pay

## 2020-05-19 ENCOUNTER — Ambulatory Visit
Admission: RE | Admit: 2020-05-19 | Discharge: 2020-05-19 | Disposition: A | Discharge status: Home / Self Care | Payer: Medicare HMO | Source: Ambulatory Visit | Attending: Internal Medicine | Admitting: Internal Medicine

## 2020-05-19 DIAGNOSIS — Z1231 Encounter for screening mammogram for malignant neoplasm of breast: Secondary | ICD-10-CM

## 2020-05-25 ENCOUNTER — Ambulatory Visit: Payer: Medicare HMO

## 2020-06-03 ENCOUNTER — Telehealth: Payer: Self-pay | Admitting: Adult Health

## 2020-06-03 NOTE — Telephone Encounter (Signed)
Called pt per 7/20 sch msg - no answer - left message for patient to call back to reschedule.

## 2020-06-12 DIAGNOSIS — I10 Essential (primary) hypertension: Secondary | ICD-10-CM | POA: Diagnosis not present

## 2020-06-12 DIAGNOSIS — E1140 Type 2 diabetes mellitus with diabetic neuropathy, unspecified: Secondary | ICD-10-CM | POA: Diagnosis not present

## 2020-06-12 DIAGNOSIS — E7849 Other hyperlipidemia: Secondary | ICD-10-CM | POA: Diagnosis not present

## 2020-06-22 ENCOUNTER — Ambulatory Visit: Payer: Medicare Other

## 2020-06-22 ENCOUNTER — Other Ambulatory Visit: Payer: Medicare HMO

## 2020-06-22 ENCOUNTER — Ambulatory Visit: Payer: Medicare HMO

## 2020-06-22 ENCOUNTER — Other Ambulatory Visit: Payer: Medicare Other

## 2020-06-24 ENCOUNTER — Other Ambulatory Visit: Payer: Self-pay

## 2020-06-24 DIAGNOSIS — Z170 Estrogen receptor positive status [ER+]: Secondary | ICD-10-CM

## 2020-06-25 ENCOUNTER — Other Ambulatory Visit: Payer: Self-pay

## 2020-06-25 ENCOUNTER — Inpatient Hospital Stay: Payer: Medicare HMO | Attending: Adult Health

## 2020-06-25 ENCOUNTER — Inpatient Hospital Stay: Payer: Medicare HMO

## 2020-06-25 VITALS — BP 113/67 | HR 71 | Temp 98.4°F | Resp 18 | Ht 64.0 in | Wt 151.0 lb

## 2020-06-25 DIAGNOSIS — Z79899 Other long term (current) drug therapy: Secondary | ICD-10-CM | POA: Diagnosis not present

## 2020-06-25 DIAGNOSIS — C50211 Malignant neoplasm of upper-inner quadrant of right female breast: Secondary | ICD-10-CM | POA: Insufficient documentation

## 2020-06-25 DIAGNOSIS — Z8249 Family history of ischemic heart disease and other diseases of the circulatory system: Secondary | ICD-10-CM | POA: Diagnosis not present

## 2020-06-25 DIAGNOSIS — Z8379 Family history of other diseases of the digestive system: Secondary | ICD-10-CM | POA: Diagnosis not present

## 2020-06-25 DIAGNOSIS — M8580 Other specified disorders of bone density and structure, unspecified site: Secondary | ICD-10-CM | POA: Insufficient documentation

## 2020-06-25 DIAGNOSIS — Z803 Family history of malignant neoplasm of breast: Secondary | ICD-10-CM | POA: Diagnosis not present

## 2020-06-25 DIAGNOSIS — Z170 Estrogen receptor positive status [ER+]: Secondary | ICD-10-CM | POA: Insufficient documentation

## 2020-06-25 LAB — CBC WITH DIFFERENTIAL (CANCER CENTER ONLY)
Abs Immature Granulocytes: 0.03 10*3/uL (ref 0.00–0.07)
Basophils Absolute: 0 10*3/uL (ref 0.0–0.1)
Basophils Relative: 0 %
Eosinophils Absolute: 0.1 10*3/uL (ref 0.0–0.5)
Eosinophils Relative: 1 %
HCT: 38.9 % (ref 36.0–46.0)
Hemoglobin: 12.6 g/dL (ref 12.0–15.0)
Immature Granulocytes: 0 %
Lymphocytes Relative: 30 %
Lymphs Abs: 2.1 10*3/uL (ref 0.7–4.0)
MCH: 28.9 pg (ref 26.0–34.0)
MCHC: 32.4 g/dL (ref 30.0–36.0)
MCV: 89.2 fL (ref 80.0–100.0)
Monocytes Absolute: 0.6 10*3/uL (ref 0.1–1.0)
Monocytes Relative: 8 %
Neutro Abs: 4.1 10*3/uL (ref 1.7–7.7)
Neutrophils Relative %: 61 %
Platelet Count: 207 10*3/uL (ref 150–400)
RBC: 4.36 MIL/uL (ref 3.87–5.11)
RDW: 13.2 % (ref 11.5–15.5)
WBC Count: 6.9 10*3/uL (ref 4.0–10.5)
nRBC: 0 % (ref 0.0–0.2)

## 2020-06-25 LAB — CMP (CANCER CENTER ONLY)
ALT: 11 U/L (ref 0–44)
AST: 16 U/L (ref 15–41)
Albumin: 3.9 g/dL (ref 3.5–5.0)
Alkaline Phosphatase: 67 U/L (ref 38–126)
Anion gap: 12 (ref 5–15)
BUN: 20 mg/dL (ref 8–23)
CO2: 21 mmol/L — ABNORMAL LOW (ref 22–32)
Calcium: 10 mg/dL (ref 8.9–10.3)
Chloride: 106 mmol/L (ref 98–111)
Creatinine: 1.14 mg/dL — ABNORMAL HIGH (ref 0.44–1.00)
GFR, Est AFR Am: 55 mL/min — ABNORMAL LOW (ref >60)
GFR, Estimated: 48 mL/min — ABNORMAL LOW (ref >60)
Glucose, Bld: 104 mg/dL — ABNORMAL HIGH (ref 70–99)
Potassium: 4.2 mmol/L (ref 3.5–5.1)
Sodium: 139 mmol/L (ref 135–145)
Total Bilirubin: 0.7 mg/dL (ref 0.3–1.2)
Total Protein: 7.3 g/dL (ref 6.5–8.1)

## 2020-06-25 MED ORDER — ZOLEDRONIC ACID 4 MG/100ML IV SOLN
4.0000 mg | Freq: Once | INTRAVENOUS | Status: AC
  Administered 2020-06-25: 4 mg via INTRAVENOUS

## 2020-06-25 MED ORDER — SODIUM CHLORIDE 0.9 % IV SOLN
Freq: Once | INTRAVENOUS | Status: AC
  Filled 2020-06-25: qty 250

## 2020-06-25 MED ORDER — ZOLEDRONIC ACID 4 MG/100ML IV SOLN
INTRAVENOUS | Status: AC
  Filled 2020-06-25: qty 100

## 2020-06-25 NOTE — Patient Instructions (Signed)
Zoledronic Acid injection (Hypercalcemia, Oncology) What is this medicine? ZOLEDRONIC ACID (ZOE le dron ik AS id) lowers the amount of calcium loss from bone. It is used to treat too much calcium in your blood from cancer. It is also used to prevent complications of cancer that has spread to the bone. This medicine may be used for other purposes; ask your health care provider or pharmacist if you have questions. COMMON BRAND NAME(S): Zometa What should I tell my health care provider before I take this medicine? They need to know if you have any of these conditions:  aspirin-sensitive asthma  cancer, especially if you are receiving medicines used to treat cancer  dental disease or wear dentures  infection  kidney disease  receiving corticosteroids like dexamethasone or prednisone  an unusual or allergic reaction to zoledronic acid, other medicines, foods, dyes, or preservatives  pregnant or trying to get pregnant  breast-feeding How should I use this medicine? This medicine is for infusion into a vein. It is given by a health care professional in a hospital or clinic setting. Talk to your pediatrician regarding the use of this medicine in children. Special care may be needed. Overdosage: If you think you have taken too much of this medicine contact a poison control center or emergency room at once. NOTE: This medicine is only for you. Do not share this medicine with others. What if I miss a dose? It is important not to miss your dose. Call your doctor or health care professional if you are unable to keep an appointment. What may interact with this medicine?  certain antibiotics given by injection  NSAIDs, medicines for pain and inflammation, like ibuprofen or naproxen  some diuretics like bumetanide, furosemide  teriparatide  thalidomide This list may not describe all possible interactions. Give your health care provider a list of all the medicines, herbs, non-prescription  drugs, or dietary supplements you use. Also tell them if you smoke, drink alcohol, or use illegal drugs. Some items may interact with your medicine. What should I watch for while using this medicine? Visit your doctor or health care professional for regular checkups. It may be some time before you see the benefit from this medicine. Do not stop taking your medicine unless your doctor tells you to. Your doctor may order blood tests or other tests to see how you are doing. Women should inform their doctor if they wish to become pregnant or think they might be pregnant. There is a potential for serious side effects to an unborn child. Talk to your health care professional or pharmacist for more information. You should make sure that you get enough calcium and vitamin D while you are taking this medicine. Discuss the foods you eat and the vitamins you take with your health care professional. Some people who take this medicine have severe bone, joint, and/or muscle pain. This medicine may also increase your risk for jaw problems or a broken thigh bone. Tell your doctor right away if you have severe pain in your jaw, bones, joints, or muscles. Tell your doctor if you have any pain that does not go away or that gets worse. Tell your dentist and dental surgeon that you are taking this medicine. You should not have major dental surgery while on this medicine. See your dentist to have a dental exam and fix any dental problems before starting this medicine. Take good care of your teeth while on this medicine. Make sure you see your dentist for regular follow-up   appointments. What side effects may I notice from receiving this medicine? Side effects that you should report to your doctor or health care professional as soon as possible:  allergic reactions like skin rash, itching or hives, swelling of the face, lips, or tongue  anxiety, confusion, or depression  breathing problems  changes in vision  eye  pain  feeling faint or lightheaded, falls  jaw pain, especially after dental work  mouth sores  muscle cramps, stiffness, or weakness  redness, blistering, peeling or loosening of the skin, including inside the mouth  trouble passing urine or change in the amount of urine Side effects that usually do not require medical attention (report to your doctor or health care professional if they continue or are bothersome):  bone, joint, or muscle pain  constipation  diarrhea  fever  hair loss  irritation at site where injected  loss of appetite  nausea, vomiting  stomach upset  trouble sleeping  trouble swallowing  weak or tired This list may not describe all possible side effects. Call your doctor for medical advice about side effects. You may report side effects to FDA at 1-800-FDA-1088. Where should I keep my medicine? This drug is given in a hospital or clinic and will not be stored at home. NOTE: This sheet is a summary. It may not cover all possible information. If you have questions about this medicine, talk to your doctor, pharmacist, or health care provider.  2020 Elsevier/Gold Standard (2014-03-29 14:19:39)  

## 2020-06-29 DIAGNOSIS — R198 Other specified symptoms and signs involving the digestive system and abdomen: Secondary | ICD-10-CM | POA: Diagnosis not present

## 2020-06-29 DIAGNOSIS — D510 Vitamin B12 deficiency anemia due to intrinsic factor deficiency: Secondary | ICD-10-CM | POA: Diagnosis not present

## 2020-06-29 DIAGNOSIS — I10 Essential (primary) hypertension: Secondary | ICD-10-CM | POA: Diagnosis not present

## 2020-06-29 DIAGNOSIS — Z6825 Body mass index (BMI) 25.0-25.9, adult: Secondary | ICD-10-CM | POA: Diagnosis not present

## 2020-06-29 DIAGNOSIS — K5732 Diverticulitis of large intestine without perforation or abscess without bleeding: Secondary | ICD-10-CM | POA: Diagnosis not present

## 2020-07-14 DIAGNOSIS — E7849 Other hyperlipidemia: Secondary | ICD-10-CM | POA: Diagnosis not present

## 2020-07-14 DIAGNOSIS — I10 Essential (primary) hypertension: Secondary | ICD-10-CM | POA: Diagnosis not present

## 2020-07-14 DIAGNOSIS — E1140 Type 2 diabetes mellitus with diabetic neuropathy, unspecified: Secondary | ICD-10-CM | POA: Diagnosis not present

## 2020-07-21 DIAGNOSIS — E538 Deficiency of other specified B group vitamins: Secondary | ICD-10-CM | POA: Diagnosis not present

## 2020-07-21 DIAGNOSIS — Z1212 Encounter for screening for malignant neoplasm of rectum: Secondary | ICD-10-CM | POA: Diagnosis not present

## 2020-07-21 DIAGNOSIS — Z1211 Encounter for screening for malignant neoplasm of colon: Secondary | ICD-10-CM | POA: Diagnosis not present

## 2020-07-29 LAB — COLOGUARD: COLOGUARD: NEGATIVE

## 2020-08-13 DIAGNOSIS — E1140 Type 2 diabetes mellitus with diabetic neuropathy, unspecified: Secondary | ICD-10-CM | POA: Diagnosis not present

## 2020-08-13 DIAGNOSIS — E7849 Other hyperlipidemia: Secondary | ICD-10-CM | POA: Diagnosis not present

## 2020-08-13 DIAGNOSIS — I10 Essential (primary) hypertension: Secondary | ICD-10-CM | POA: Diagnosis not present

## 2020-08-18 DIAGNOSIS — D510 Vitamin B12 deficiency anemia due to intrinsic factor deficiency: Secondary | ICD-10-CM | POA: Diagnosis not present

## 2020-09-12 DIAGNOSIS — E1140 Type 2 diabetes mellitus with diabetic neuropathy, unspecified: Secondary | ICD-10-CM | POA: Diagnosis not present

## 2020-09-12 DIAGNOSIS — I10 Essential (primary) hypertension: Secondary | ICD-10-CM | POA: Diagnosis not present

## 2020-09-12 DIAGNOSIS — E7849 Other hyperlipidemia: Secondary | ICD-10-CM | POA: Diagnosis not present

## 2020-09-23 DIAGNOSIS — E538 Deficiency of other specified B group vitamins: Secondary | ICD-10-CM | POA: Diagnosis not present

## 2020-10-13 DIAGNOSIS — I10 Essential (primary) hypertension: Secondary | ICD-10-CM | POA: Diagnosis not present

## 2020-10-13 DIAGNOSIS — E1140 Type 2 diabetes mellitus with diabetic neuropathy, unspecified: Secondary | ICD-10-CM | POA: Diagnosis not present

## 2020-10-13 DIAGNOSIS — E7849 Other hyperlipidemia: Secondary | ICD-10-CM | POA: Diagnosis not present

## 2020-10-20 DIAGNOSIS — D510 Vitamin B12 deficiency anemia due to intrinsic factor deficiency: Secondary | ICD-10-CM | POA: Diagnosis not present

## 2020-11-25 DIAGNOSIS — D510 Vitamin B12 deficiency anemia due to intrinsic factor deficiency: Secondary | ICD-10-CM | POA: Diagnosis not present

## 2020-12-18 ENCOUNTER — Telehealth: Payer: Self-pay | Admitting: Adult Health

## 2020-12-18 NOTE — Telephone Encounter (Signed)
Left message with rescheduled appointment due to provider's covid clinic. Gave option to call back to reschedule if needed. 

## 2020-12-21 DIAGNOSIS — E1142 Type 2 diabetes mellitus with diabetic polyneuropathy: Secondary | ICD-10-CM | POA: Diagnosis not present

## 2020-12-21 DIAGNOSIS — E1140 Type 2 diabetes mellitus with diabetic neuropathy, unspecified: Secondary | ICD-10-CM | POA: Diagnosis not present

## 2020-12-21 DIAGNOSIS — Z6825 Body mass index (BMI) 25.0-25.9, adult: Secondary | ICD-10-CM | POA: Diagnosis not present

## 2020-12-21 DIAGNOSIS — I10 Essential (primary) hypertension: Secondary | ICD-10-CM | POA: Diagnosis not present

## 2020-12-21 DIAGNOSIS — E7849 Other hyperlipidemia: Secondary | ICD-10-CM | POA: Diagnosis not present

## 2020-12-21 DIAGNOSIS — M25511 Pain in right shoulder: Secondary | ICD-10-CM | POA: Diagnosis not present

## 2020-12-21 DIAGNOSIS — M75101 Unspecified rotator cuff tear or rupture of right shoulder, not specified as traumatic: Secondary | ICD-10-CM | POA: Diagnosis not present

## 2020-12-21 DIAGNOSIS — R201 Hypoesthesia of skin: Secondary | ICD-10-CM | POA: Diagnosis not present

## 2020-12-21 DIAGNOSIS — Z1331 Encounter for screening for depression: Secondary | ICD-10-CM | POA: Diagnosis not present

## 2020-12-23 ENCOUNTER — Encounter: Payer: Medicare HMO | Admitting: Adult Health

## 2020-12-24 ENCOUNTER — Encounter: Payer: Medicare HMO | Admitting: Adult Health

## 2020-12-28 ENCOUNTER — Telehealth: Payer: Self-pay | Admitting: Adult Health

## 2020-12-28 ENCOUNTER — Telehealth: Payer: Self-pay | Admitting: Hematology and Oncology

## 2020-12-28 NOTE — Telephone Encounter (Signed)
Pt called requesting appt for treatment. Sent msg to VG, LC, and desk nurse for clarification on what needs to be scheduled. Advised pt that I will call her back after I get a response for scheduling.

## 2020-12-28 NOTE — Telephone Encounter (Signed)
Scheduled appts per 2/14 staff msg. Pt confirmed appt date and time.

## 2021-01-08 ENCOUNTER — Ambulatory Visit: Payer: Medicare HMO

## 2021-01-08 ENCOUNTER — Ambulatory Visit: Payer: Medicare HMO | Admitting: Adult Health

## 2021-01-08 ENCOUNTER — Inpatient Hospital Stay: Payer: Medicare HMO

## 2021-01-11 DIAGNOSIS — E7849 Other hyperlipidemia: Secondary | ICD-10-CM | POA: Diagnosis not present

## 2021-01-11 DIAGNOSIS — M7541 Impingement syndrome of right shoulder: Secondary | ICD-10-CM | POA: Diagnosis not present

## 2021-01-11 DIAGNOSIS — I10 Essential (primary) hypertension: Secondary | ICD-10-CM | POA: Diagnosis not present

## 2021-01-11 DIAGNOSIS — E1140 Type 2 diabetes mellitus with diabetic neuropathy, unspecified: Secondary | ICD-10-CM | POA: Diagnosis not present

## 2021-01-14 ENCOUNTER — Other Ambulatory Visit: Payer: Self-pay | Admitting: *Deleted

## 2021-01-14 DIAGNOSIS — Z170 Estrogen receptor positive status [ER+]: Secondary | ICD-10-CM

## 2021-01-17 NOTE — Assessment & Plan Note (Signed)
Right breast invasive ductal carcinoma T1 C. N0 M0 ER/PR positive HER-2 negative status post lumpectomy and radiation in December 2012, currently on anastrozole since February 2013 completed February 2018.  Breast Cancer Surveillance: 1. Breast exam 05/22/2017: Normal 2. Mammogram 08/23/16 No abnormalities. Postsurgical changes. Breast Density Category C. I recommended that she get 3-D mammograms for surveillance.  Osteopenia:bone density done 10/16/2015: T score -2.3 osteopenia (risk of major osteoporotic fracture 13%) On Zometa every 6 months  Return to clinic in 1 year for follow-up

## 2021-01-18 ENCOUNTER — Inpatient Hospital Stay: Payer: Medicare HMO

## 2021-01-18 ENCOUNTER — Other Ambulatory Visit: Payer: Self-pay

## 2021-01-18 ENCOUNTER — Inpatient Hospital Stay: Payer: Medicare HMO | Attending: Hematology and Oncology

## 2021-01-18 ENCOUNTER — Inpatient Hospital Stay: Payer: Medicare HMO | Admitting: Hematology and Oncology

## 2021-01-18 ENCOUNTER — Telehealth: Payer: Self-pay | Admitting: Hematology and Oncology

## 2021-01-18 DIAGNOSIS — Z170 Estrogen receptor positive status [ER+]: Secondary | ICD-10-CM

## 2021-01-18 DIAGNOSIS — Z79811 Long term (current) use of aromatase inhibitors: Secondary | ICD-10-CM | POA: Diagnosis not present

## 2021-01-18 DIAGNOSIS — C50211 Malignant neoplasm of upper-inner quadrant of right female breast: Secondary | ICD-10-CM | POA: Diagnosis not present

## 2021-01-18 DIAGNOSIS — M8580 Other specified disorders of bone density and structure, unspecified site: Secondary | ICD-10-CM | POA: Diagnosis not present

## 2021-01-18 DIAGNOSIS — Z923 Personal history of irradiation: Secondary | ICD-10-CM | POA: Diagnosis not present

## 2021-01-18 DIAGNOSIS — Z7982 Long term (current) use of aspirin: Secondary | ICD-10-CM | POA: Insufficient documentation

## 2021-01-18 DIAGNOSIS — Z79899 Other long term (current) drug therapy: Secondary | ICD-10-CM | POA: Diagnosis not present

## 2021-01-18 LAB — CBC WITH DIFFERENTIAL (CANCER CENTER ONLY)
Abs Immature Granulocytes: 0.01 10*3/uL (ref 0.00–0.07)
Basophils Absolute: 0 10*3/uL (ref 0.0–0.1)
Basophils Relative: 0 %
Eosinophils Absolute: 0.1 10*3/uL (ref 0.0–0.5)
Eosinophils Relative: 2 %
HCT: 34.8 % — ABNORMAL LOW (ref 36.0–46.0)
Hemoglobin: 11.6 g/dL — ABNORMAL LOW (ref 12.0–15.0)
Immature Granulocytes: 0 %
Lymphocytes Relative: 33 %
Lymphs Abs: 1.6 10*3/uL (ref 0.7–4.0)
MCH: 29.1 pg (ref 26.0–34.0)
MCHC: 33.3 g/dL (ref 30.0–36.0)
MCV: 87.4 fL (ref 80.0–100.0)
Monocytes Absolute: 0.4 10*3/uL (ref 0.1–1.0)
Monocytes Relative: 9 %
Neutro Abs: 2.6 10*3/uL (ref 1.7–7.7)
Neutrophils Relative %: 56 %
Platelet Count: 200 10*3/uL (ref 150–400)
RBC: 3.98 MIL/uL (ref 3.87–5.11)
RDW: 14.2 % (ref 11.5–15.5)
WBC Count: 4.7 10*3/uL (ref 4.0–10.5)
nRBC: 0 % (ref 0.0–0.2)

## 2021-01-18 LAB — CMP (CANCER CENTER ONLY)
ALT: 15 U/L (ref 0–44)
AST: 16 U/L (ref 15–41)
Albumin: 3.7 g/dL (ref 3.5–5.0)
Alkaline Phosphatase: 54 U/L (ref 38–126)
Anion gap: 5 (ref 5–15)
BUN: 24 mg/dL — ABNORMAL HIGH (ref 8–23)
CO2: 25 mmol/L (ref 22–32)
Calcium: 9.3 mg/dL (ref 8.9–10.3)
Chloride: 109 mmol/L (ref 98–111)
Creatinine: 1.11 mg/dL — ABNORMAL HIGH (ref 0.44–1.00)
GFR, Estimated: 52 mL/min — ABNORMAL LOW (ref >60)
Glucose, Bld: 104 mg/dL — ABNORMAL HIGH (ref 70–99)
Potassium: 4.2 mmol/L (ref 3.5–5.1)
Sodium: 139 mmol/L (ref 135–145)
Total Bilirubin: 0.5 mg/dL (ref 0.3–1.2)
Total Protein: 6.8 g/dL (ref 6.5–8.1)

## 2021-01-18 MED ORDER — SODIUM CHLORIDE 0.9 % IV SOLN
Freq: Once | INTRAVENOUS | Status: AC
  Filled 2021-01-18: qty 250

## 2021-01-18 MED ORDER — ZOLEDRONIC ACID 4 MG/100ML IV SOLN
4.0000 mg | Freq: Once | INTRAVENOUS | Status: AC
  Administered 2021-01-18: 4 mg via INTRAVENOUS

## 2021-01-18 MED ORDER — ZINC GLUCONATE 50 MG PO TABS
50.0000 mg | ORAL_TABLET | Freq: Every day | ORAL | Status: AC
Start: 2021-01-18 — End: ?

## 2021-01-18 MED ORDER — MELOXICAM 15 MG PO TBDP: 1.0000 | ORAL_TABLET | Freq: Every day | ORAL | Status: AC

## 2021-01-18 MED ORDER — VITAMIN C 250 MG PO TABS
250.0000 mg | ORAL_TABLET | Freq: Every day | ORAL | Status: AC
Start: 2021-01-18 — End: ?

## 2021-01-18 MED ORDER — ZOLEDRONIC ACID 4 MG/100ML IV SOLN
INTRAVENOUS | Status: AC
  Filled 2021-01-18: qty 100

## 2021-01-18 NOTE — Telephone Encounter (Signed)
Scheduled appts per 3/7 los. Gave pt a print out of AVS.

## 2021-01-18 NOTE — Patient Instructions (Signed)
[facility] Discharge Instructions for Patients Receiving Chemotherapy  Today you received the following chemotherapy agents Zoledronic Acid  To help prevent nausea and vomiting after your treatment, we encourage you to take your nausea medication as directed.    If you develop nausea and vomiting that is not controlled by your nausea medication, call the clinic.   BELOW ARE SYMPTOMS THAT SHOULD BE REPORTED IMMEDIATELY:  *FEVER GREATER THAN 100.5 F  *CHILLS WITH OR WITHOUT FEVER  NAUSEA AND VOMITING THAT IS NOT CONTROLLED WITH YOUR NAUSEA MEDICATION  *UNUSUAL SHORTNESS OF BREATH  *UNUSUAL BRUISING OR BLEEDING  TENDERNESS IN MOUTH AND THROAT WITH OR WITHOUT PRESENCE OF ULCERS  *URINARY PROBLEMS  *BOWEL PROBLEMS  UNUSUAL RASH Items with * indicate a potential emergency and should be followed up as soon as possible.  Feel free to call the clinic should you have any questions or concerns. The clinic phone number is [phone].  Please show the [place] at check-in to the Emergency Department and triage nurse.

## 2021-01-18 NOTE — Progress Notes (Signed)
Patient Care Team: Redmond School, MD as PCP - General (Internal Medicine) Ubaldo Glassing, Marny Lowenstein, MD (Inactive) (Obstetrics and Gynecology) Marcy Panning, MD (Internal Medicine) Eppie Gibson, MD (Radiation Oncology)  DIAGNOSIS:    ICD-10-CM   1. Malignant neoplasm of upper-inner quadrant of right breast in female, estrogen receptor positive ([place])  C50.211    Z17.0     SUMMARY OF ONCOLOGIC HISTORY: Oncology History  Breast cancer of upper-inner quadrant of right female breast ([place])  09/07/2011 Initial Diagnosis   Cancer of upper-inner quadrant of female breast   10/25/2011 Surgery   Right breast lumpectomy; 1.2 cm IDC No LVI; 3 SLN Neg; Er 100%, PR 100%; Her 2 Neg; Ki 67: 7%, T1CN0Mo (stage IA)   12/05/2011 - 12/30/2011 Radiation Therapy   Radiation to lumpectomy site   01/11/2012 - 12/23/2016 Anti-estrogen oral therapy   Arimidex 1 mg daily     CHIEF COMPLIANT: Surveillance of breast cancer  INTERVAL HISTORY: [name] is a 75 y.o. with above-mentioned history of right breast cancer treated with lumpectomy, radiation, and who completed 5 years of antiestrogen therapy and currently on surveillance. Mammogram on 05/19/20 showed no evidence of malignancy bilaterally. She presents to the clinic today for follow-up.  She denies any lumps or nodules in the breast.  She does not like receiving Zometa but she is going to do whatever she needs to do for her bone health.  ALLERGIES:  is allergic to codeine.  MEDICATIONS:  Current Outpatient Medications  Medication Sig Dispense Refill  . ALPRAZolam (XANAX) 0.5 MG tablet Take 0.5 mg by mouth at bedtime as needed.     . Ascorbic Acid (VITAMIN C PO) Take by mouth daily.    Marland Kitchen aspirin 81 MG tablet Take 81 mg by mouth daily.      Marland Kitchen atorvastatin (LIPITOR) 40 MG tablet Take 40 mg by mouth daily.  0  . cyanocobalamin (,VITAMIN B-12,) 1000 MCG/ML injection Inject 1,000 mcg into the muscle every 30 (thirty) days.    Marland Kitchen denosumab (PROLIA) 60  MG/ML SOSY injection Inject 60 mg into the skin every 6 (six) months.    . gabapentin (NEURONTIN) 300 MG capsule Take 1 capsule (300 mg total) by mouth 2 (two) times daily. (Patient taking differently: Take 600 mg by mouth daily. ) 60 capsule 5  . lisinopril (PRINIVIL,ZESTRIL) 2.5 MG tablet     . metFORMIN (GLUCOPHAGE-XR) 500 MG 24 hr tablet Take 500 mg by mouth 2 (two) times daily.     . Multiple Vitamins-Minerals (ZINC PO) Take by mouth daily.    . ranitidine (ZANTAC) 75 MG tablet Take 75 mg by mouth as needed for heartburn. Takes occasionally    . venlafaxine (EFFEXOR) 25 MG tablet TAKE 1 & 1/2 TABLETS BY MOUTH DAILY. 45 tablet 5  . VITAMIN D PO Take by mouth daily.     No current facility-administered medications for this visit.    PHYSICAL EXAMINATION: ECOG PERFORMANCE STATUS: 1 - Symptomatic but completely ambulatory  Vitals:   01/18/21 0956  BP: (!) 154/68  Pulse: 71  Resp: 17  Temp: 97.6 F (36.4 C)  SpO2: 96%   Filed Weights   01/18/21 0956  Weight: 153 lb 6.4 oz (69.6 kg)    BREAST: No palpable masses or nodules in either right or left breasts. No palpable axillary supraclavicular or infraclavicular adenopathy no breast tenderness or nipple discharge. (exam performed in the presence of a chaperone)  LABORATORY DATA:  I have reviewed the data as listed  CMP Latest Ref Rng & Units 06/25/2020 12/23/2019 06/24/2019  Glucose 70 - 99 mg/dL 104(H) 91 97  BUN 8 - 23 mg/dL 20 23 24(H)  Creatinine 0.44 - 1.00 mg/dL 1.14(H) 0.99 1.22(H)  Sodium 135 - 145 mmol/L 139 140 139  Potassium 3.5 - 5.1 mmol/L 4.2 4.5 4.4  Chloride 98 - 111 mmol/L 106 104 103  CO2 22 - 32 mmol/L 21(L) 26 23  Calcium 8.9 - 10.3 mg/dL 10.0 9.3 9.8  Total Protein 6.5 - 8.1 g/dL 7.3 7.4 7.2  Total Bilirubin 0.3 - 1.2 mg/dL 0.7 0.5 0.6  Alkaline Phos 38 - 126 U/L 67 65 63  AST 15 - 41 U/L 16 17 19   ALT 0 - 44 U/L 11 19 16     Lab Results  Component Value Date   WBC 4.7 01/18/2021   HGB 11.6 (L)  01/18/2021   HCT 34.8 (L) 01/18/2021   MCV 87.4 01/18/2021   PLT 200 01/18/2021   NEUTROABS 2.6 01/18/2021    ASSESSMENT & PLAN:  Breast cancer of upper-inner quadrant of right female breast Right breast invasive ductal carcinoma T1 C. N0 M0 ER/PR positive HER-2 negative status post lumpectomy and radiation in December 2012, currently on anastrozole since February 2013 completed February 2018.  Breast Cancer Surveillance: 1. Breast exam 05/22/2017: Normal 2. Mammogram 08/23/16 No abnormalities. Postsurgical changes. Breast Density Category C. I recommended that she get 3-D mammograms for surveillance.  Osteopenia:bone density done 10/16/2015: T score -2.3 osteopenia (risk of major osteoporotic fracture 13%) On Zometa every 6 months She does not really like receiving the Zometa infusions. She will undergo bone density test along with her mammogram in July 2022.  Return to clinic in 1 year for follow-up  with Zometa.  In 6 months for Zometa and labs.    No orders of the defined types were placed in this encounter.  The patient has a good understanding of the overall plan. she agrees with it. she will call with any problems that may develop before the next visit here.  Total time spent: 30 mins including face to face time and time spent for planning, charting and coordination of care  Rulon Eisenmenger, MD, MPH 01/18/2021  I, Cloyde Reams Dorshimer, am acting as scribe for Dr. Nicholas Lose.  I have reviewed the above documentation for accuracy and completeness, and I agree with the above.

## 2021-01-25 DIAGNOSIS — D510 Vitamin B12 deficiency anemia due to intrinsic factor deficiency: Secondary | ICD-10-CM | POA: Diagnosis not present

## 2021-02-10 DIAGNOSIS — E1140 Type 2 diabetes mellitus with diabetic neuropathy, unspecified: Secondary | ICD-10-CM | POA: Diagnosis not present

## 2021-02-10 DIAGNOSIS — I10 Essential (primary) hypertension: Secondary | ICD-10-CM | POA: Diagnosis not present

## 2021-02-10 DIAGNOSIS — E7849 Other hyperlipidemia: Secondary | ICD-10-CM | POA: Diagnosis not present

## 2021-02-24 ENCOUNTER — Other Ambulatory Visit: Payer: Self-pay | Admitting: Hematology and Oncology

## 2021-02-24 DIAGNOSIS — Z1231 Encounter for screening mammogram for malignant neoplasm of breast: Secondary | ICD-10-CM

## 2021-02-25 DIAGNOSIS — E538 Deficiency of other specified B group vitamins: Secondary | ICD-10-CM | POA: Diagnosis not present

## 2021-03-22 DIAGNOSIS — D510 Vitamin B12 deficiency anemia due to intrinsic factor deficiency: Secondary | ICD-10-CM | POA: Diagnosis not present

## 2021-04-13 DIAGNOSIS — I10 Essential (primary) hypertension: Secondary | ICD-10-CM | POA: Diagnosis not present

## 2021-04-13 DIAGNOSIS — E1140 Type 2 diabetes mellitus with diabetic neuropathy, unspecified: Secondary | ICD-10-CM | POA: Diagnosis not present

## 2021-04-13 DIAGNOSIS — E7849 Other hyperlipidemia: Secondary | ICD-10-CM | POA: Diagnosis not present

## 2021-04-26 DIAGNOSIS — D510 Vitamin B12 deficiency anemia due to intrinsic factor deficiency: Secondary | ICD-10-CM | POA: Diagnosis not present

## 2021-05-13 DIAGNOSIS — E782 Mixed hyperlipidemia: Secondary | ICD-10-CM | POA: Diagnosis not present

## 2021-05-13 DIAGNOSIS — I10 Essential (primary) hypertension: Secondary | ICD-10-CM | POA: Diagnosis not present

## 2021-05-13 DIAGNOSIS — E1140 Type 2 diabetes mellitus with diabetic neuropathy, unspecified: Secondary | ICD-10-CM | POA: Diagnosis not present

## 2021-06-01 DIAGNOSIS — Z6825 Body mass index (BMI) 25.0-25.9, adult: Secondary | ICD-10-CM | POA: Diagnosis not present

## 2021-06-01 DIAGNOSIS — Z0001 Encounter for general adult medical examination with abnormal findings: Secondary | ICD-10-CM | POA: Diagnosis not present

## 2021-06-01 DIAGNOSIS — L821 Other seborrheic keratosis: Secondary | ICD-10-CM | POA: Diagnosis not present

## 2021-06-01 DIAGNOSIS — E782 Mixed hyperlipidemia: Secondary | ICD-10-CM | POA: Diagnosis not present

## 2021-06-01 DIAGNOSIS — E1140 Type 2 diabetes mellitus with diabetic neuropathy, unspecified: Secondary | ICD-10-CM | POA: Diagnosis not present

## 2021-06-01 DIAGNOSIS — E559 Vitamin D deficiency, unspecified: Secondary | ICD-10-CM | POA: Diagnosis not present

## 2021-06-01 DIAGNOSIS — I10 Essential (primary) hypertension: Secondary | ICD-10-CM | POA: Diagnosis not present

## 2021-06-01 DIAGNOSIS — E119 Type 2 diabetes mellitus without complications: Secondary | ICD-10-CM | POA: Diagnosis not present

## 2021-06-01 DIAGNOSIS — Z1331 Encounter for screening for depression: Secondary | ICD-10-CM | POA: Diagnosis not present

## 2021-06-01 DIAGNOSIS — E538 Deficiency of other specified B group vitamins: Secondary | ICD-10-CM | POA: Diagnosis not present

## 2021-06-01 DIAGNOSIS — E663 Overweight: Secondary | ICD-10-CM | POA: Diagnosis not present

## 2021-06-01 DIAGNOSIS — E539 Vitamin B deficiency, unspecified: Secondary | ICD-10-CM | POA: Diagnosis not present

## 2021-06-01 DIAGNOSIS — Z1389 Encounter for screening for other disorder: Secondary | ICD-10-CM | POA: Diagnosis not present

## 2021-06-02 DIAGNOSIS — K5732 Diverticulitis of large intestine without perforation or abscess without bleeding: Secondary | ICD-10-CM | POA: Diagnosis not present

## 2021-06-25 DIAGNOSIS — D519 Vitamin B12 deficiency anemia, unspecified: Secondary | ICD-10-CM | POA: Diagnosis not present

## 2021-07-14 DIAGNOSIS — I10 Essential (primary) hypertension: Secondary | ICD-10-CM | POA: Diagnosis not present

## 2021-07-14 DIAGNOSIS — M79601 Pain in right arm: Secondary | ICD-10-CM | POA: Diagnosis not present

## 2021-07-14 DIAGNOSIS — Z6825 Body mass index (BMI) 25.0-25.9, adult: Secondary | ICD-10-CM | POA: Diagnosis not present

## 2021-07-14 DIAGNOSIS — E1140 Type 2 diabetes mellitus with diabetic neuropathy, unspecified: Secondary | ICD-10-CM | POA: Diagnosis not present

## 2021-07-14 DIAGNOSIS — E663 Overweight: Secondary | ICD-10-CM | POA: Diagnosis not present

## 2021-07-14 DIAGNOSIS — Z1331 Encounter for screening for depression: Secondary | ICD-10-CM | POA: Diagnosis not present

## 2021-07-21 ENCOUNTER — Other Ambulatory Visit: Payer: Medicare HMO

## 2021-07-21 ENCOUNTER — Ambulatory Visit: Payer: Medicare HMO

## 2021-07-27 DIAGNOSIS — E119 Type 2 diabetes mellitus without complications: Secondary | ICD-10-CM | POA: Diagnosis not present

## 2021-07-28 ENCOUNTER — Other Ambulatory Visit: Payer: Medicare HMO

## 2021-07-28 ENCOUNTER — Ambulatory Visit: Payer: Medicare HMO

## 2021-08-06 ENCOUNTER — Ambulatory Visit
Admission: RE | Admit: 2021-08-06 | Discharge: 2021-08-06 | Disposition: A | Discharge status: Home / Self Care | Payer: Medicare HMO | Source: Ambulatory Visit | Attending: Hematology and Oncology | Admitting: Hematology and Oncology

## 2021-08-06 ENCOUNTER — Other Ambulatory Visit: Payer: Self-pay

## 2021-08-06 ENCOUNTER — Encounter: Payer: Self-pay | Admitting: Hematology and Oncology

## 2021-08-06 DIAGNOSIS — M8589 Other specified disorders of bone density and structure, multiple sites: Secondary | ICD-10-CM | POA: Diagnosis not present

## 2021-08-06 DIAGNOSIS — Z1231 Encounter for screening mammogram for malignant neoplasm of breast: Secondary | ICD-10-CM | POA: Diagnosis not present

## 2021-08-06 DIAGNOSIS — Z170 Estrogen receptor positive status [ER+]: Secondary | ICD-10-CM

## 2021-08-06 DIAGNOSIS — Z780 Asymptomatic menopausal state: Secondary | ICD-10-CM | POA: Diagnosis not present

## 2021-08-10 ENCOUNTER — Telehealth: Payer: Self-pay | Admitting: Hematology and Oncology

## 2021-08-10 NOTE — Telephone Encounter (Signed)
I discussed with the patient the results of the bone density test showing a T score of -2.2 which is the same bone density as she had in 2019.  However it is important to note that she had been receiving Zometa all along up until recently.  She decided to hold off on taking any more Zometa for the time being. I recommended that we watch and monitor her bone density with recheck in 2 years.  At that time her bone density gets worse and she can take bisphosphonates again.  I instructed her to be careful and not to put herself at risk of falls.

## 2021-08-13 ENCOUNTER — Other Ambulatory Visit: Payer: Self-pay | Admitting: Hematology and Oncology

## 2021-08-13 DIAGNOSIS — R928 Other abnormal and inconclusive findings on diagnostic imaging of breast: Secondary | ICD-10-CM

## 2021-08-16 DIAGNOSIS — E538 Deficiency of other specified B group vitamins: Secondary | ICD-10-CM | POA: Diagnosis not present

## 2021-08-26 DIAGNOSIS — L821 Other seborrheic keratosis: Secondary | ICD-10-CM | POA: Diagnosis not present

## 2021-08-26 DIAGNOSIS — C44722 Squamous cell carcinoma of skin of right lower limb, including hip: Secondary | ICD-10-CM | POA: Diagnosis not present

## 2021-08-26 DIAGNOSIS — L570 Actinic keratosis: Secondary | ICD-10-CM | POA: Diagnosis not present

## 2021-08-26 DIAGNOSIS — X32XXXD Exposure to sunlight, subsequent encounter: Secondary | ICD-10-CM | POA: Diagnosis not present

## 2021-08-26 DIAGNOSIS — D1801 Hemangioma of skin and subcutaneous tissue: Secondary | ICD-10-CM | POA: Diagnosis not present

## 2021-08-26 DIAGNOSIS — D225 Melanocytic nevi of trunk: Secondary | ICD-10-CM | POA: Diagnosis not present

## 2021-08-27 DIAGNOSIS — M25511 Pain in right shoulder: Secondary | ICD-10-CM | POA: Diagnosis not present

## 2021-09-01 ENCOUNTER — Ambulatory Visit: Admission: RE | Admit: 2021-09-01 | Payer: Medicare HMO | Source: Ambulatory Visit

## 2021-09-01 ENCOUNTER — Other Ambulatory Visit: Payer: Self-pay

## 2021-09-01 ENCOUNTER — Encounter: Payer: Self-pay | Admitting: Hematology and Oncology

## 2021-09-01 ENCOUNTER — Ambulatory Visit
Admission: RE | Admit: 2021-09-01 | Discharge: 2021-09-01 | Disposition: A | Discharge status: Home / Self Care | Payer: Medicare HMO | Source: Ambulatory Visit | Attending: Hematology and Oncology | Admitting: Hematology and Oncology

## 2021-09-01 DIAGNOSIS — R922 Inconclusive mammogram: Secondary | ICD-10-CM | POA: Diagnosis not present

## 2021-09-01 DIAGNOSIS — R928 Other abnormal and inconclusive findings on diagnostic imaging of breast: Secondary | ICD-10-CM

## 2021-09-01 DIAGNOSIS — Z853 Personal history of malignant neoplasm of breast: Secondary | ICD-10-CM | POA: Diagnosis not present

## 2021-09-13 DIAGNOSIS — M25511 Pain in right shoulder: Secondary | ICD-10-CM | POA: Diagnosis not present

## 2021-09-17 DIAGNOSIS — K5792 Diverticulitis of intestine, part unspecified, without perforation or abscess without bleeding: Secondary | ICD-10-CM | POA: Diagnosis not present

## 2021-09-17 DIAGNOSIS — I10 Essential (primary) hypertension: Secondary | ICD-10-CM | POA: Diagnosis not present

## 2021-09-17 DIAGNOSIS — E663 Overweight: Secondary | ICD-10-CM | POA: Diagnosis not present

## 2021-09-17 DIAGNOSIS — Z6824 Body mass index (BMI) 24.0-24.9, adult: Secondary | ICD-10-CM | POA: Diagnosis not present

## 2021-09-17 DIAGNOSIS — E1140 Type 2 diabetes mellitus with diabetic neuropathy, unspecified: Secondary | ICD-10-CM | POA: Diagnosis not present

## 2021-09-17 DIAGNOSIS — E538 Deficiency of other specified B group vitamins: Secondary | ICD-10-CM | POA: Diagnosis not present

## 2021-10-01 DIAGNOSIS — M75101 Unspecified rotator cuff tear or rupture of right shoulder, not specified as traumatic: Secondary | ICD-10-CM | POA: Diagnosis not present

## 2021-10-13 DIAGNOSIS — E782 Mixed hyperlipidemia: Secondary | ICD-10-CM | POA: Diagnosis not present

## 2021-10-13 DIAGNOSIS — E1140 Type 2 diabetes mellitus with diabetic neuropathy, unspecified: Secondary | ICD-10-CM | POA: Diagnosis not present

## 2021-10-13 DIAGNOSIS — I10 Essential (primary) hypertension: Secondary | ICD-10-CM | POA: Diagnosis not present

## 2021-10-28 DIAGNOSIS — Z08 Encounter for follow-up examination after completed treatment for malignant neoplasm: Secondary | ICD-10-CM | POA: Diagnosis not present

## 2021-10-28 DIAGNOSIS — Z85828 Personal history of other malignant neoplasm of skin: Secondary | ICD-10-CM | POA: Diagnosis not present

## 2021-11-29 ENCOUNTER — Other Ambulatory Visit: Payer: Self-pay

## 2021-11-29 ENCOUNTER — Other Ambulatory Visit (HOSPITAL_COMMUNITY): Payer: Self-pay | Admitting: Physician Assistant

## 2021-11-29 ENCOUNTER — Ambulatory Visit (HOSPITAL_COMMUNITY)
Admission: RE | Admit: 2021-11-29 | Discharge: 2021-11-29 | Disposition: A | Discharge status: Home / Self Care | Payer: Medicare HMO | Source: Ambulatory Visit | Attending: Physician Assistant | Admitting: Physician Assistant

## 2021-11-29 DIAGNOSIS — Z1331 Encounter for screening for depression: Secondary | ICD-10-CM | POA: Diagnosis not present

## 2021-11-29 DIAGNOSIS — M546 Pain in thoracic spine: Secondary | ICD-10-CM | POA: Insufficient documentation

## 2021-11-29 DIAGNOSIS — M47814 Spondylosis without myelopathy or radiculopathy, thoracic region: Secondary | ICD-10-CM | POA: Diagnosis not present

## 2021-11-29 DIAGNOSIS — R0781 Pleurodynia: Secondary | ICD-10-CM

## 2021-11-29 DIAGNOSIS — Z6825 Body mass index (BMI) 25.0-25.9, adult: Secondary | ICD-10-CM | POA: Diagnosis not present

## 2022-01-04 DIAGNOSIS — E538 Deficiency of other specified B group vitamins: Secondary | ICD-10-CM | POA: Diagnosis not present

## 2022-01-11 DIAGNOSIS — E782 Mixed hyperlipidemia: Secondary | ICD-10-CM | POA: Diagnosis not present

## 2022-01-11 DIAGNOSIS — E1140 Type 2 diabetes mellitus with diabetic neuropathy, unspecified: Secondary | ICD-10-CM | POA: Diagnosis not present

## 2022-01-11 DIAGNOSIS — I10 Essential (primary) hypertension: Secondary | ICD-10-CM | POA: Diagnosis not present

## 2022-01-17 ENCOUNTER — Other Ambulatory Visit: Payer: Self-pay | Admitting: *Deleted

## 2022-01-17 DIAGNOSIS — C50211 Malignant neoplasm of upper-inner quadrant of right female breast: Secondary | ICD-10-CM

## 2022-01-17 DIAGNOSIS — Z170 Estrogen receptor positive status [ER+]: Secondary | ICD-10-CM

## 2022-01-17 NOTE — Progress Notes (Incomplete)
? ?Patient Care Team: ?Redmond School, MD as PCP - General (Internal Medicine) ?Selinda Orion, MD (Inactive) (Obstetrics and Gynecology) ?Marcy Panning, MD (Internal Medicine) ?Eppie Gibson, MD (Radiation Oncology) ? ?DIAGNOSIS: No diagnosis found. ? ?SUMMARY OF ONCOLOGIC HISTORY: ?Oncology History  ?Breast cancer of upper-inner quadrant of right female breast ([place])  ?09/07/2011 Initial Diagnosis  ? Cancer of upper-inner quadrant of female breast ?  ?10/25/2011 Surgery  ? Right breast lumpectomy; 1.2 cm IDC No LVI; 3 SLN Neg; Er 100%, PR 100%; Her 2 Neg; Ki 67: 7%, T1CN0Mo (stage IA) ?  ?12/05/2011 - 12/30/2011 Radiation Therapy  ? Radiation to lumpectomy site ?  ?01/11/2012 - 12/23/2016 Anti-estrogen oral therapy  ? Arimidex 1 mg daily ?  ?Osteopenia  ? ? ?CHIEF COMPLIANT: Surveillance of breast cancer ? ?INTERVAL HISTORY: [name] is a 76 y.o. with above-mentioned history of right breast cancer treated with lumpectomy, radiation, and who completed 5 years of antiestrogen therapy and currently on surveillance. Mammogram on 09/01/2021 showed no evidence of malignancy bilaterally. She presents to the clinic today for follow-up.  ? ?ALLERGIES:  is allergic to codeine. ? ?MEDICATIONS:  ?Current Outpatient Medications  ?Medication Sig Dispense Refill  ? ALPRAZolam (XANAX) 0.5 MG tablet Take 0.5 mg by mouth at bedtime as needed.     ? Ascorbic Acid (VITAMIN C PO) Take by mouth daily.    ? aspirin 81 MG tablet Take 81 mg by mouth daily.      ? atorvastatin (LIPITOR) 40 MG tablet Take 40 mg by mouth daily.  0  ? cyanocobalamin (,VITAMIN B-12,) 1000 MCG/ML injection Inject 1,000 mcg into the muscle every 30 (thirty) days.    ? gabapentin (NEURONTIN) 300 MG capsule Take 1 capsule (300 mg total) by mouth 2 (two) times daily. (Patient taking differently: Take 600 mg by mouth daily. ) 60 capsule 5  ? lisinopril (PRINIVIL,ZESTRIL) 2.5 MG tablet     ? Meloxicam 15 MG TBDP Take 1 tablet by mouth daily. 30 tablet   ?  metFORMIN (GLUCOPHAGE-XR) 500 MG 24 hr tablet Take 500 mg by mouth 2 (two) times daily.     ? Multiple Vitamins-Minerals (ZINC PO) Take by mouth daily.    ? ranitidine (ZANTAC) 75 MG tablet Take 75 mg by mouth as needed for heartburn. Takes occasionally    ? venlafaxine (EFFEXOR) 25 MG tablet TAKE 1 & 1/2 TABLETS BY MOUTH DAILY. 45 tablet 5  ? vitamin C (ASCORBIC ACID) 250 MG tablet Take 1 tablet (250 mg total) by mouth daily.    ? VITAMIN D PO Take by mouth daily.    ? zinc gluconate 50 MG tablet Take 1 tablet (50 mg total) by mouth daily.    ? ?No current facility-administered medications for this visit.  ? ? ?PHYSICAL EXAMINATION: ?ECOG PERFORMANCE STATUS: {CHL ONC ECOG VF:[id number]} ? ?There were no vitals filed for this visit. ?There were no vitals filed for this visit. ? ?BREAST:*** No palpable masses or nodules in either right or left breasts. No palpable axillary supraclavicular or infraclavicular adenopathy no breast tenderness or nipple discharge. (exam performed in the presence of a chaperone) ? ?LABORATORY DATA:  ?I have reviewed the data as listed ?CMP Latest Ref Rng & Units 01/18/2021 06/25/2020 12/23/2019  ?Glucose 70 - 99 mg/dL 104(H) 104(H) 91  ?BUN 8 - 23 mg/dL 24(H) 20 23  ?Creatinine 0.44 - 1.00 mg/dL 1.11(H) 1.14(H) 0.99  ?Sodium 135 - 145 mmol/L 139 139 140  ?Potassium 3.5 - 5.1 mmol/L  4.2 4.2 4.5  ?Chloride 98 - 111 mmol/L 109 106 104  ?CO2 22 - 32 mmol/L 25 21(L) 26  ?Calcium 8.9 - 10.3 mg/dL 9.3 10.0 9.3  ?Total Protein 6.5 - 8.1 g/dL 6.8 7.3 7.4  ?Total Bilirubin 0.3 - 1.2 mg/dL 0.5 0.7 0.5  ?Alkaline Phos 38 - 126 U/L 54 67 65  ?AST 15 - 41 U/L '16 16 17  '$ ?ALT 0 - 44 U/L '15 11 19  '$ ? ? ?Lab Results  ?Component Value Date  ? WBC 4.7 01/18/2021  ? HGB 11.6 (L) 01/18/2021  ? HCT 34.8 (L) 01/18/2021  ? MCV 87.4 01/18/2021  ? PLT 200 01/18/2021  ? NEUTROABS 2.6 01/18/2021  ? ? ?ASSESSMENT & PLAN:  ?No problem-specific Assessment & Plan notes found for this encounter. ? ? ? ?No orders of the defined  types were placed in this encounter. ? ?The patient has a good understanding of the overall plan. she agrees with it. she will call with any problems that may develop before the next visit here. ? ?Total time spent: *** mins including face to face time and time spent for planning, charting and coordination of care ? ?Rulon Eisenmenger, MD, MPH ?01/17/2022 ? ?I, Thana Ates, am acting as scribe for Dr. Nicholas Lose. ? ?{insert scribe attestation} ? ? ? ? ? ?

## 2022-01-18 ENCOUNTER — Inpatient Hospital Stay: Payer: Medicare HMO

## 2022-01-18 ENCOUNTER — Telehealth: Payer: Self-pay | Admitting: *Deleted

## 2022-01-18 ENCOUNTER — Inpatient Hospital Stay: Payer: Medicare HMO | Admitting: Hematology and Oncology

## 2022-01-18 NOTE — Assessment & Plan Note (Deleted)
Right breast invasive ductal carcinoma T1 C. N0 M0 ER/PR positive HER-2 negative status post lumpectomy and radiation in December 2012, currently on anastrozole since February 2013?completed February 2018. ?? ?Breast Cancer Surveillance: ?1. Breast exam?05/22/2017: Normal ?2. Mammogram 08/23/16?No abnormalities. Postsurgical changes. Breast Density Category C. I recommended that she get 3-D mammograms for surveillance. ??? ?Osteopenia:?bone density done 10/16/2015: T score -2.3 osteopenia (risk of major osteoporotic fracture 13%) ?She does not really like receiving the Zometa infusions.  Therefore we gave her a 2-year break ?08/06/2021: Bone density: T score -2.2  ?Repeat bone density 2024 to resume Zometa ?? ?Return to clinic in 1 year for follow-up? ?

## 2022-01-18 NOTE — Telephone Encounter (Signed)
Pt no show for appts today.  RN attempt x1 to contact pt.  No answer, LVM for pt to return call to the office.  ?

## 2022-01-24 NOTE — Progress Notes (Incomplete)
Patient Care Team: Redmond School, MD as PCP - General (Internal Medicine) Ubaldo Glassing Marny Lowenstein, MD (Inactive) (Obstetrics and Gynecology) Marcy Panning, MD (Internal Medicine) Eppie Gibson, MD (Radiation Oncology)  DIAGNOSIS: No diagnosis found.  SUMMARY OF ONCOLOGIC HISTORY: Oncology History  Breast cancer of upper-inner quadrant of right female breast ([place])  09/07/2011 Initial Diagnosis   Cancer of upper-inner quadrant of female breast   10/25/2011 Surgery   Right breast lumpectomy; 1.2 cm IDC No LVI; 3 SLN Neg; Er 100%, PR 100%; Her 2 Neg; Ki 67: 7%, T1CN0Mo (stage IA)   12/05/2011 - 12/30/2011 Radiation Therapy   Radiation to lumpectomy site   01/11/2012 - 12/23/2016 Anti-estrogen oral therapy   Arimidex 1 mg daily   Osteopenia    CHIEF COMPLIANT: Surveillance of breast cancer  INTERVAL HISTORY: [name] is a  76 y.o. with above-mentioned history of right breast cancer treated with lumpectomy, radiation, and who completed 5 years of antiestrogen therapy and currently on surveillance. Mammogram on 05/19/20 showed no evidence of malignancy bilaterally. She presents to the clinic today for follow-up.    ALLERGIES:  is allergic to codeine.  MEDICATIONS:  Current Outpatient Medications  Medication Sig Dispense Refill   ALPRAZolam (XANAX) 0.5 MG tablet Take 0.5 mg by mouth at bedtime as needed.      Ascorbic Acid (VITAMIN C PO) Take by mouth daily.     aspirin 81 MG tablet Take 81 mg by mouth daily.       atorvastatin (LIPITOR) 40 MG tablet Take 40 mg by mouth daily.  0   cyanocobalamin (,VITAMIN B-12,) 1000 MCG/ML injection Inject 1,000 mcg into the muscle every 30 (thirty) days.     gabapentin (NEURONTIN) 300 MG capsule Take 1 capsule (300 mg total) by mouth 2 (two) times daily. (Patient taking differently: Take 600 mg by mouth daily. ) 60 capsule 5   lisinopril (PRINIVIL,ZESTRIL) 2.5 MG tablet      Meloxicam 15 MG TBDP Take 1 tablet by mouth daily. 30 tablet     metFORMIN (GLUCOPHAGE-XR) 500 MG 24 hr tablet Take 500 mg by mouth 2 (two) times daily.      Multiple Vitamins-Minerals (ZINC PO) Take by mouth daily.     ranitidine (ZANTAC) 75 MG tablet Take 75 mg by mouth as needed for heartburn. Takes occasionally     venlafaxine (EFFEXOR) 25 MG tablet TAKE 1 & 1/2 TABLETS BY MOUTH DAILY. 45 tablet 5   vitamin C (ASCORBIC ACID) 250 MG tablet Take 1 tablet (250 mg total) by mouth daily.     VITAMIN D PO Take by mouth daily.     zinc gluconate 50 MG tablet Take 1 tablet (50 mg total) by mouth daily.     No current facility-administered medications for this visit.    PHYSICAL EXAMINATION: ECOG PERFORMANCE STATUS: {CHL ONC ECOG PS:[id number]}  There were no vitals filed for this visit. There were no vitals filed for this visit.  BREAST:*** No palpable masses or nodules in either right or left breasts. No palpable axillary supraclavicular or infraclavicular adenopathy no breast tenderness or nipple discharge. (exam performed in the presence of a chaperone)  LABORATORY DATA:  I have reviewed the data as listed CMP Latest Ref Rng & Units 01/18/2021 06/25/2020 12/23/2019  Glucose 70 - 99 mg/dL 104(H) 104(H) 91  BUN 8 - 23 mg/dL 24(H) 20 23  Creatinine 0.44 - 1.00 mg/dL 1.11(H) 1.14(H) 0.99  Sodium 135 - 145 mmol/L 139 139 140  Potassium 3.5 -  5.1 mmol/L 4.2 4.2 4.5  Chloride 98 - 111 mmol/L 109 106 104  CO2 22 - 32 mmol/L 25 21(L) 26  Calcium 8.9 - 10.3 mg/dL 9.3 10.0 9.3  Total Protein 6.5 - 8.1 g/dL 6.8 7.3 7.4  Total Bilirubin 0.3 - 1.2 mg/dL 0.5 0.7 0.5  Alkaline Phos 38 - 126 U/L 54 67 65  AST 15 - 41 U/L '16 16 17  '$ ALT 0 - 44 U/L '15 11 19    '$ Lab Results  Component Value Date   WBC 4.7 01/18/2021   HGB 11.6 (L) 01/18/2021   HCT 34.8 (L) 01/18/2021   MCV 87.4 01/18/2021   PLT 200 01/18/2021   NEUTROABS 2.6 01/18/2021    ASSESSMENT & PLAN:  No problem-specific Assessment & Plan notes found for this encounter.    No orders of the defined  types were placed in this encounter.  The patient has a good understanding of the overall plan. she agrees with it. she will call with any problems that may develop before the next visit here. Total time spent: 30 mins including face to face time and time spent for planning, charting and co-ordination of care   Suzzette Righter, [place] 01/24/22    I, Gardiner Coins, am acting as a scribe for Dr. Lindi Adie

## 2022-01-25 ENCOUNTER — Inpatient Hospital Stay: Payer: Medicare HMO | Admitting: Hematology and Oncology

## 2022-01-25 ENCOUNTER — Other Ambulatory Visit: Payer: Self-pay

## 2022-01-25 ENCOUNTER — Inpatient Hospital Stay: Payer: Medicare HMO

## 2022-01-25 ENCOUNTER — Inpatient Hospital Stay: Payer: Medicare HMO | Attending: Hematology and Oncology

## 2022-01-25 DIAGNOSIS — Z853 Personal history of malignant neoplasm of breast: Secondary | ICD-10-CM | POA: Diagnosis not present

## 2022-01-25 DIAGNOSIS — C50211 Malignant neoplasm of upper-inner quadrant of right female breast: Secondary | ICD-10-CM

## 2022-01-25 DIAGNOSIS — M8580 Other specified disorders of bone density and structure, unspecified site: Secondary | ICD-10-CM | POA: Diagnosis not present

## 2022-01-25 DIAGNOSIS — Z170 Estrogen receptor positive status [ER+]: Secondary | ICD-10-CM

## 2022-01-25 LAB — CMP (CANCER CENTER ONLY)
ALT: 12 U/L (ref 0–44)
AST: 15 U/L (ref 15–41)
Albumin: 4.1 g/dL (ref 3.5–5.0)
Alkaline Phosphatase: 72 U/L (ref 38–126)
Anion gap: 9 (ref 5–15)
BUN: 31 mg/dL — ABNORMAL HIGH (ref 8–23)
CO2: 26 mmol/L (ref 22–32)
Calcium: 9.4 mg/dL (ref 8.9–10.3)
Chloride: 102 mmol/L (ref 98–111)
Creatinine: 1.22 mg/dL — ABNORMAL HIGH (ref 0.44–1.00)
GFR, Estimated: 46 mL/min — ABNORMAL LOW (ref >60)
Glucose, Bld: 193 mg/dL — ABNORMAL HIGH (ref 70–99)
Potassium: 4.3 mmol/L (ref 3.5–5.1)
Sodium: 137 mmol/L (ref 135–145)
Total Bilirubin: 0.6 mg/dL (ref 0.3–1.2)
Total Protein: 6.8 g/dL (ref 6.5–8.1)

## 2022-01-25 LAB — CBC WITH DIFFERENTIAL (CANCER CENTER ONLY)
Abs Immature Granulocytes: 0.02 10*3/uL (ref 0.00–0.07)
Basophils Absolute: 0 10*3/uL (ref 0.0–0.1)
Basophils Relative: 1 %
Eosinophils Absolute: 0.1 10*3/uL (ref 0.0–0.5)
Eosinophils Relative: 2 %
HCT: 35.7 % — ABNORMAL LOW (ref 36.0–46.0)
Hemoglobin: 11.5 g/dL — ABNORMAL LOW (ref 12.0–15.0)
Immature Granulocytes: 1 %
Lymphocytes Relative: 40 %
Lymphs Abs: 1.8 10*3/uL (ref 0.7–4.0)
MCH: 28.6 pg (ref 26.0–34.0)
MCHC: 32.2 g/dL (ref 30.0–36.0)
MCV: 88.8 fL (ref 80.0–100.0)
Monocytes Absolute: 0.3 10*3/uL (ref 0.1–1.0)
Monocytes Relative: 7 %
Neutro Abs: 2.3 10*3/uL (ref 1.7–7.7)
Neutrophils Relative %: 49 %
Platelet Count: 204 10*3/uL (ref 150–400)
RBC: 4.02 MIL/uL (ref 3.87–5.11)
RDW: 13.4 % (ref 11.5–15.5)
WBC Count: 4.4 10*3/uL (ref 4.0–10.5)
nRBC: 0 % (ref 0.0–0.2)

## 2022-01-25 MED ORDER — GABAPENTIN 300 MG PO CAPS: 900.0000 mg | ORAL_CAPSULE | Freq: Every day | ORAL | Status: AC

## 2022-01-25 NOTE — Assessment & Plan Note (Addendum)
Right breast invasive ductal carcinoma T1 C. N0 M0 ER/PR positive HER-2 negative status post lumpectomy and radiation in December 2012, currently on anastrozole since February 2013?completed February 2018. ?? ?Breast Cancer Surveillance: ?1. Breast exam?01/25/2022: Normal ?2. Mammogram 08/12/2021: Benign, breast density category C ??? ?Osteopenia: ?Bone density done 10/16/2015: T score -2.3 osteopenia (risk of major osteoporotic fracture 13%)  ?Bone density 08/06/2021: T score -2.2 ?  ?She does not really like receiving the Zometa infusions. ? ?We decided to cut down Zometa to once a year. ?

## 2022-02-01 DIAGNOSIS — E538 Deficiency of other specified B group vitamins: Secondary | ICD-10-CM | POA: Diagnosis not present

## 2022-02-11 DIAGNOSIS — E782 Mixed hyperlipidemia: Secondary | ICD-10-CM | POA: Diagnosis not present

## 2022-02-11 DIAGNOSIS — I10 Essential (primary) hypertension: Secondary | ICD-10-CM | POA: Diagnosis not present

## 2022-02-11 DIAGNOSIS — E1140 Type 2 diabetes mellitus with diabetic neuropathy, unspecified: Secondary | ICD-10-CM | POA: Diagnosis not present

## 2022-03-13 DIAGNOSIS — E1140 Type 2 diabetes mellitus with diabetic neuropathy, unspecified: Secondary | ICD-10-CM | POA: Diagnosis not present

## 2022-03-13 DIAGNOSIS — I10 Essential (primary) hypertension: Secondary | ICD-10-CM | POA: Diagnosis not present

## 2022-03-13 DIAGNOSIS — E782 Mixed hyperlipidemia: Secondary | ICD-10-CM | POA: Diagnosis not present

## 2022-03-17 DIAGNOSIS — E538 Deficiency of other specified B group vitamins: Secondary | ICD-10-CM | POA: Diagnosis not present

## 2022-04-26 DIAGNOSIS — E538 Deficiency of other specified B group vitamins: Secondary | ICD-10-CM | POA: Diagnosis not present

## 2022-06-01 IMAGING — MG MM DIGITAL DIAGNOSTIC UNILAT*R* W/ TOMO W/ CAD
4 series · 4 of 12 positions shown · non-contrast
Comparison: Previous exam(s).

CLINICAL DATA: 75-year-old female presenting as a recall from
screening for possible right breast asymmetry. Patient has a history
of right breast cancer in 2002 status post lumpectomy. Family
history of breast cancer in the patient's sister and daughter.

EXAM:
DIGITAL DIAGNOSTIC UNILATERAL RIGHT MAMMOGRAM WITH TOMOSYNTHESIS AND
CAD
TECHNIQUE: Right digital diagnostic mammography and breast tomosynthesis was
performed. The images were evaluated with computer-aided detection.

[R ML synth-2D]
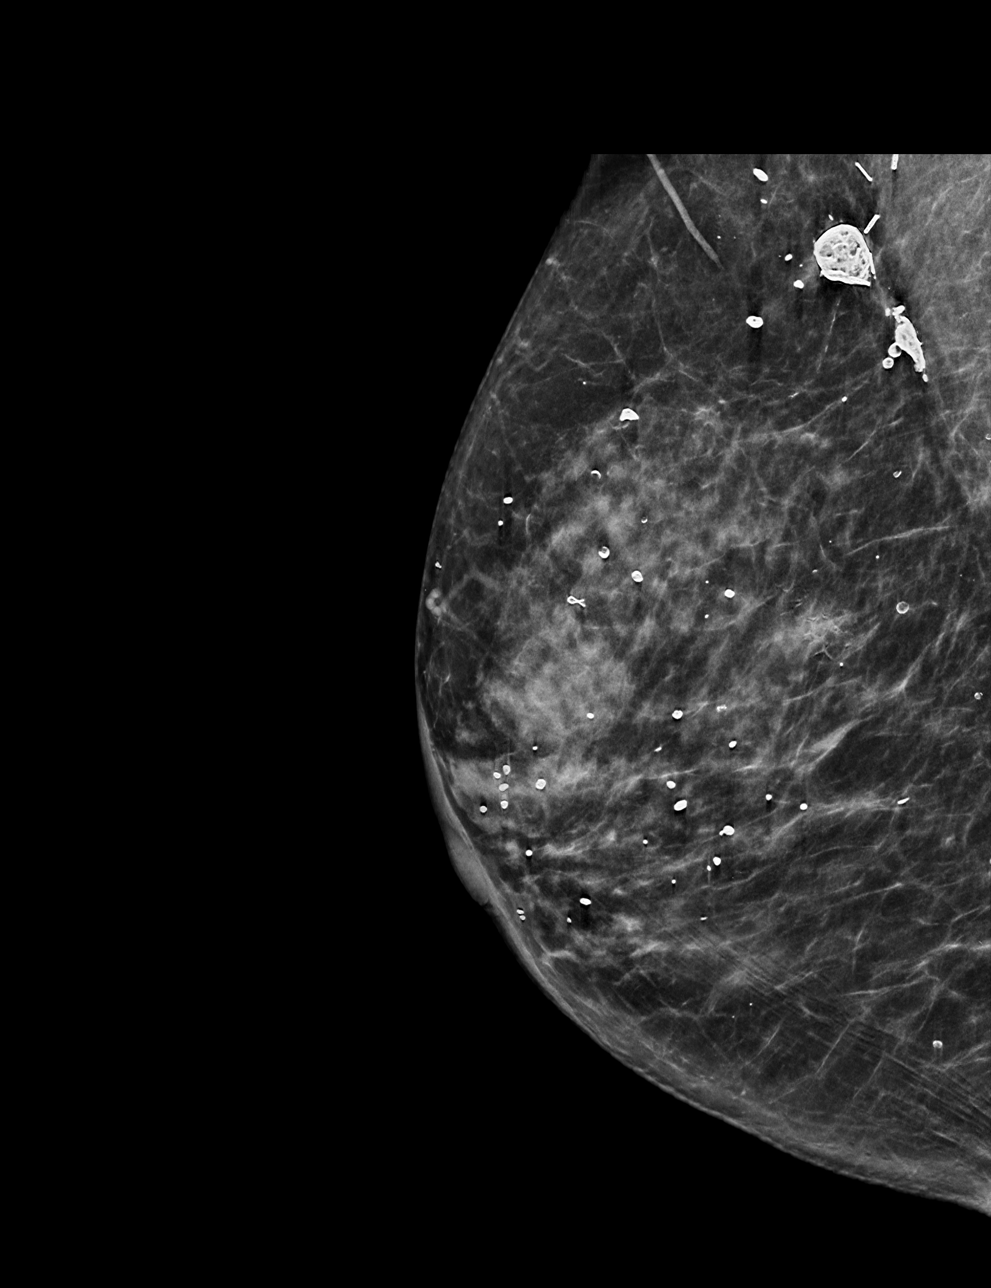

[R MLO synth-2D]
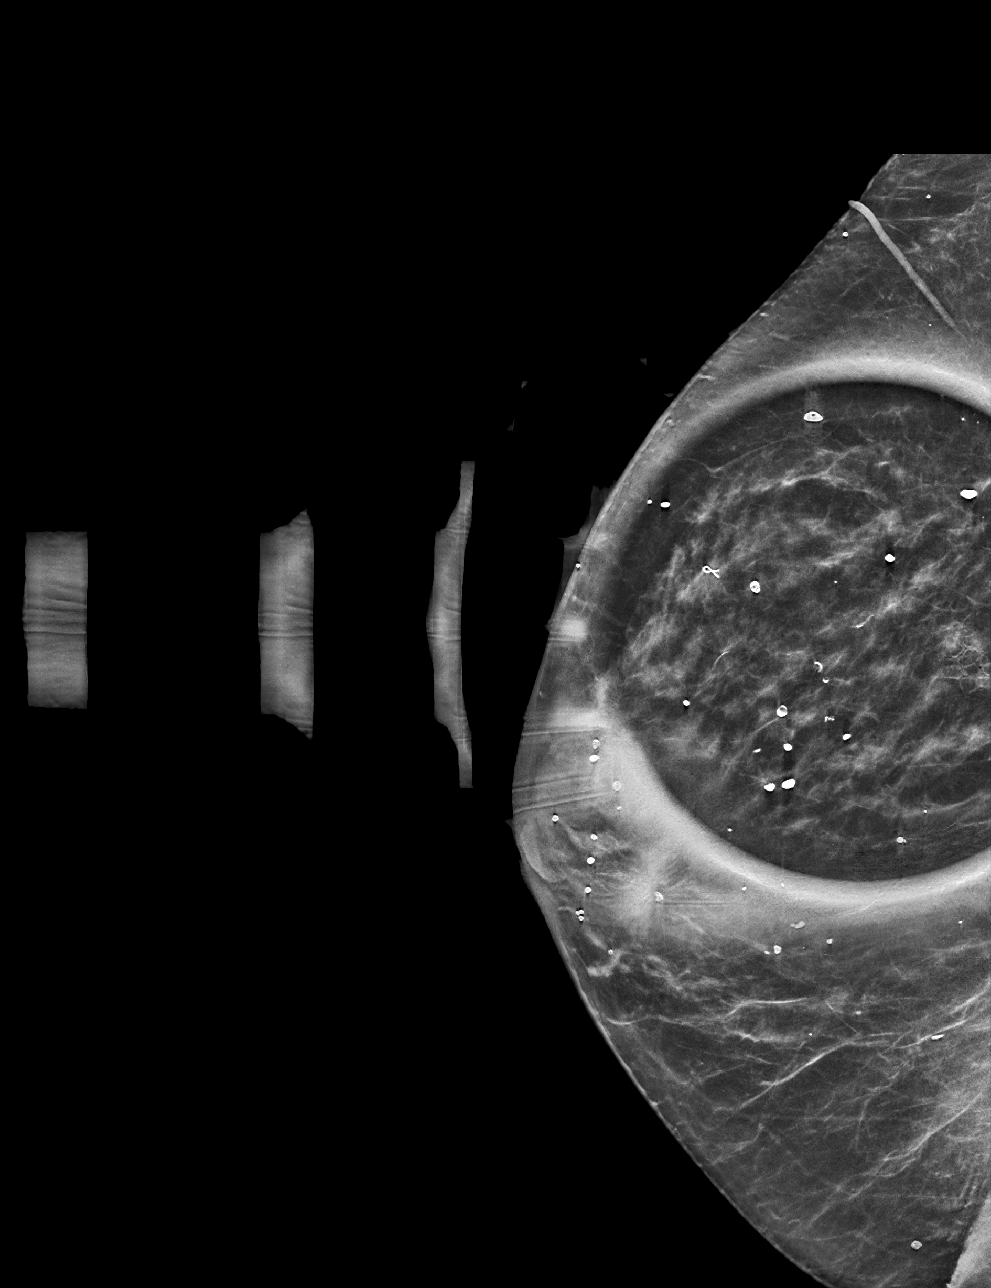

[R ML tomo · tomo slice 30/59.0]
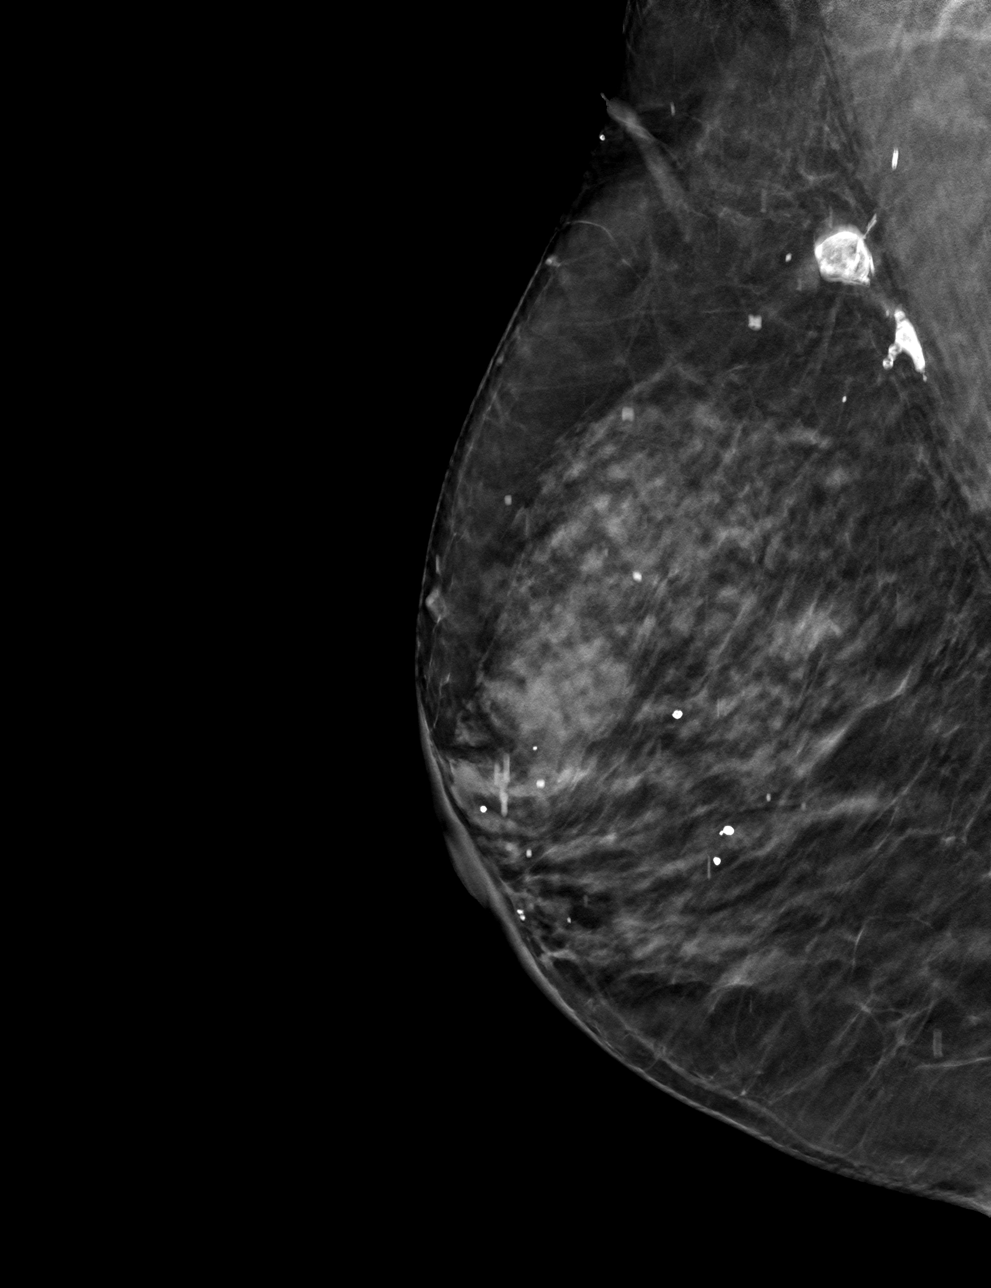

[R MLO tomo · tomo slice 27/52.0]
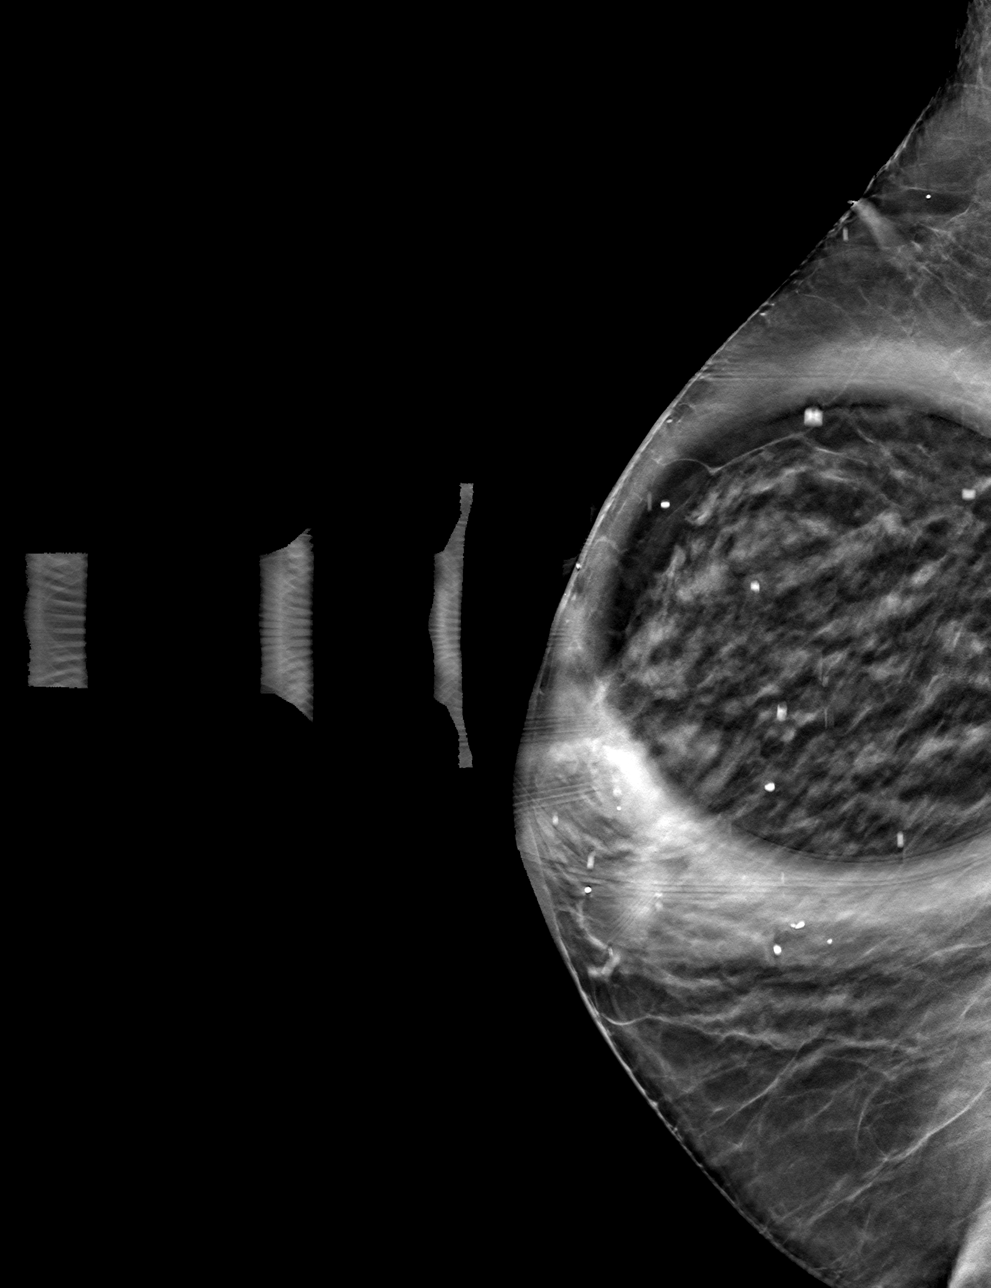

[4 of 12 positions shown; findings below may reference images not displayed]

ACR Breast Density Category c: The breast tissue is heterogeneously
dense, which may obscure small masses.
FINDINGS: Spot compression tomosynthesis MLO and full field mL tomosynthesis
views of the right breast were performed for a questioned asymmetry
seen only on MLO view in the superior right breast. On the
additional imaging the tissue in this area disperses without
persistent asymmetry, mass, or distortion. The finding on screening
mammogram most likely represented normal overlapping fibroglandular
tissue.
IMPRESSION: No mammographic evidence of malignancy in the right breast.

RECOMMENDATION:
Screening mammogram in one year.(Code:[id number])

I have discussed the findings and recommendations with the patient.
If applicable, a reminder letter will be sent to the patient
regarding the next appointment.

BI-RADS CATEGORY  1: Negative.

## 2022-06-24 DIAGNOSIS — I10 Essential (primary) hypertension: Secondary | ICD-10-CM | POA: Diagnosis not present

## 2022-06-24 DIAGNOSIS — G894 Chronic pain syndrome: Secondary | ICD-10-CM | POA: Diagnosis not present

## 2022-06-24 DIAGNOSIS — E1140 Type 2 diabetes mellitus with diabetic neuropathy, unspecified: Secondary | ICD-10-CM | POA: Diagnosis not present

## 2022-06-24 DIAGNOSIS — Z6824 Body mass index (BMI) 24.0-24.9, adult: Secondary | ICD-10-CM | POA: Diagnosis not present

## 2022-06-24 DIAGNOSIS — E538 Deficiency of other specified B group vitamins: Secondary | ICD-10-CM | POA: Diagnosis not present

## 2022-06-24 DIAGNOSIS — J042 Acute laryngotracheitis: Secondary | ICD-10-CM | POA: Diagnosis not present

## 2022-07-22 DIAGNOSIS — E538 Deficiency of other specified B group vitamins: Secondary | ICD-10-CM | POA: Diagnosis not present

## 2022-08-22 DIAGNOSIS — E538 Deficiency of other specified B group vitamins: Secondary | ICD-10-CM | POA: Diagnosis not present

## 2022-08-29 ENCOUNTER — Other Ambulatory Visit: Payer: Self-pay | Admitting: Hematology and Oncology

## 2022-08-29 DIAGNOSIS — Z1231 Encounter for screening mammogram for malignant neoplasm of breast: Secondary | ICD-10-CM

## 2022-08-29 IMAGING — DX DG RIBS 2V*L*
3 series · 3 of 3 positions shown · non-contrast
Comparison: None.

CLINICAL DATA: Left-sided back pain for 1 month radiating under
left breast

EXAM:
LEFT RIBS - 2 VIEW

[rib pa (1 of 2)]
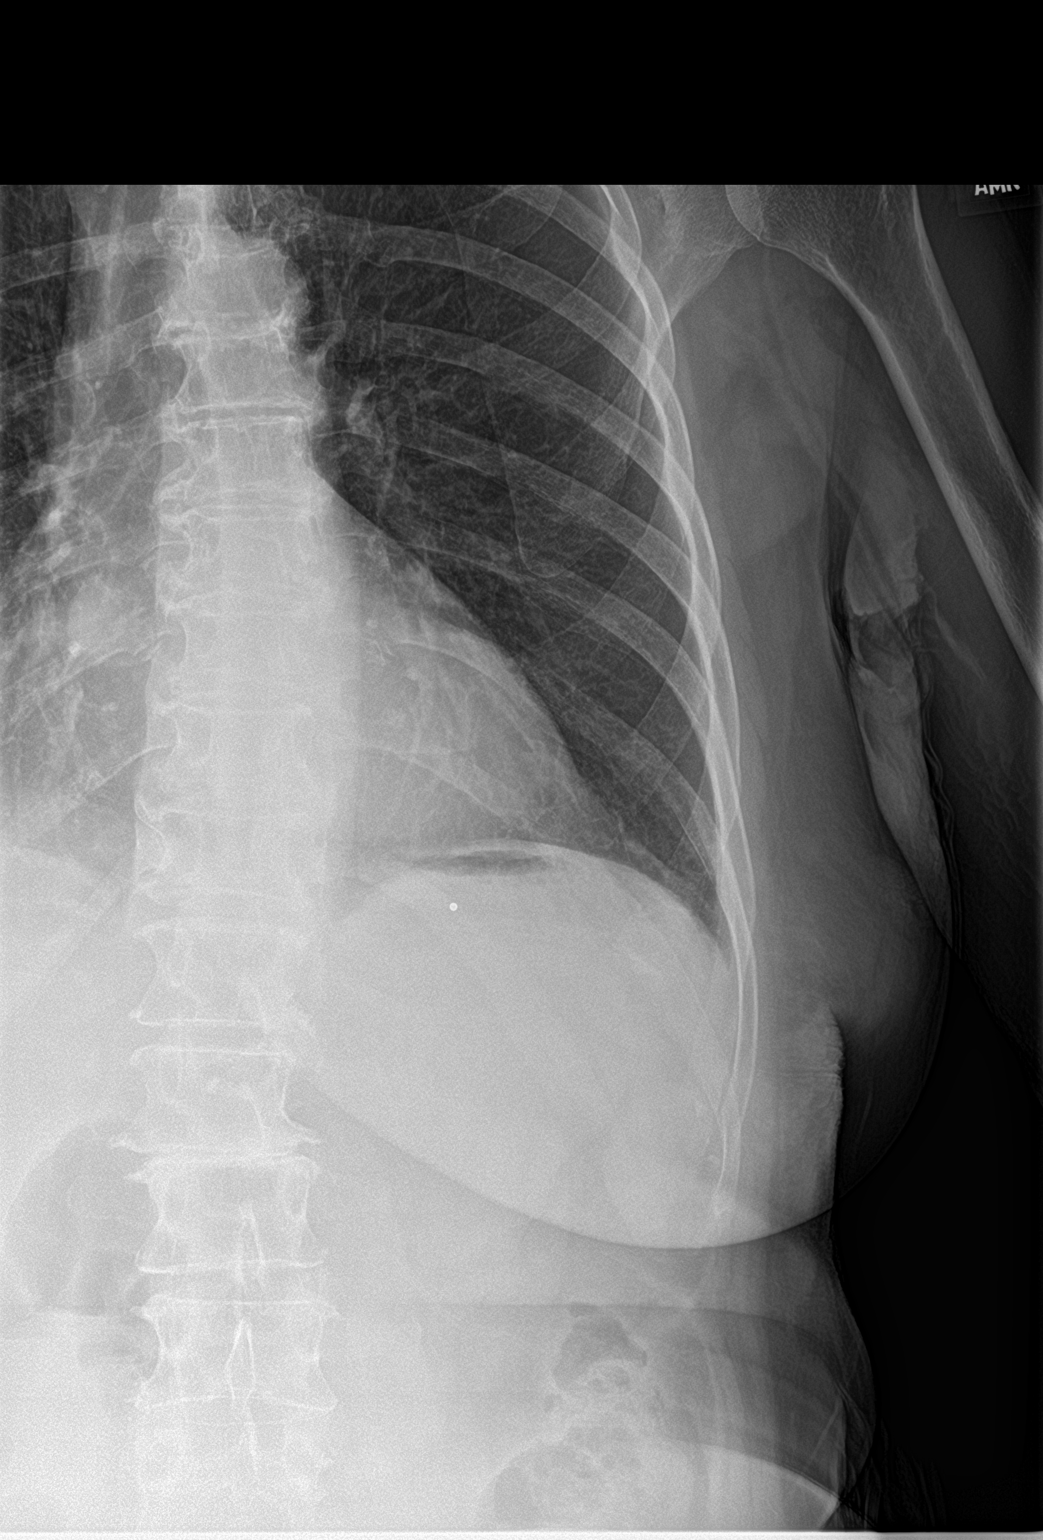

[rib pa (2 of 2)]
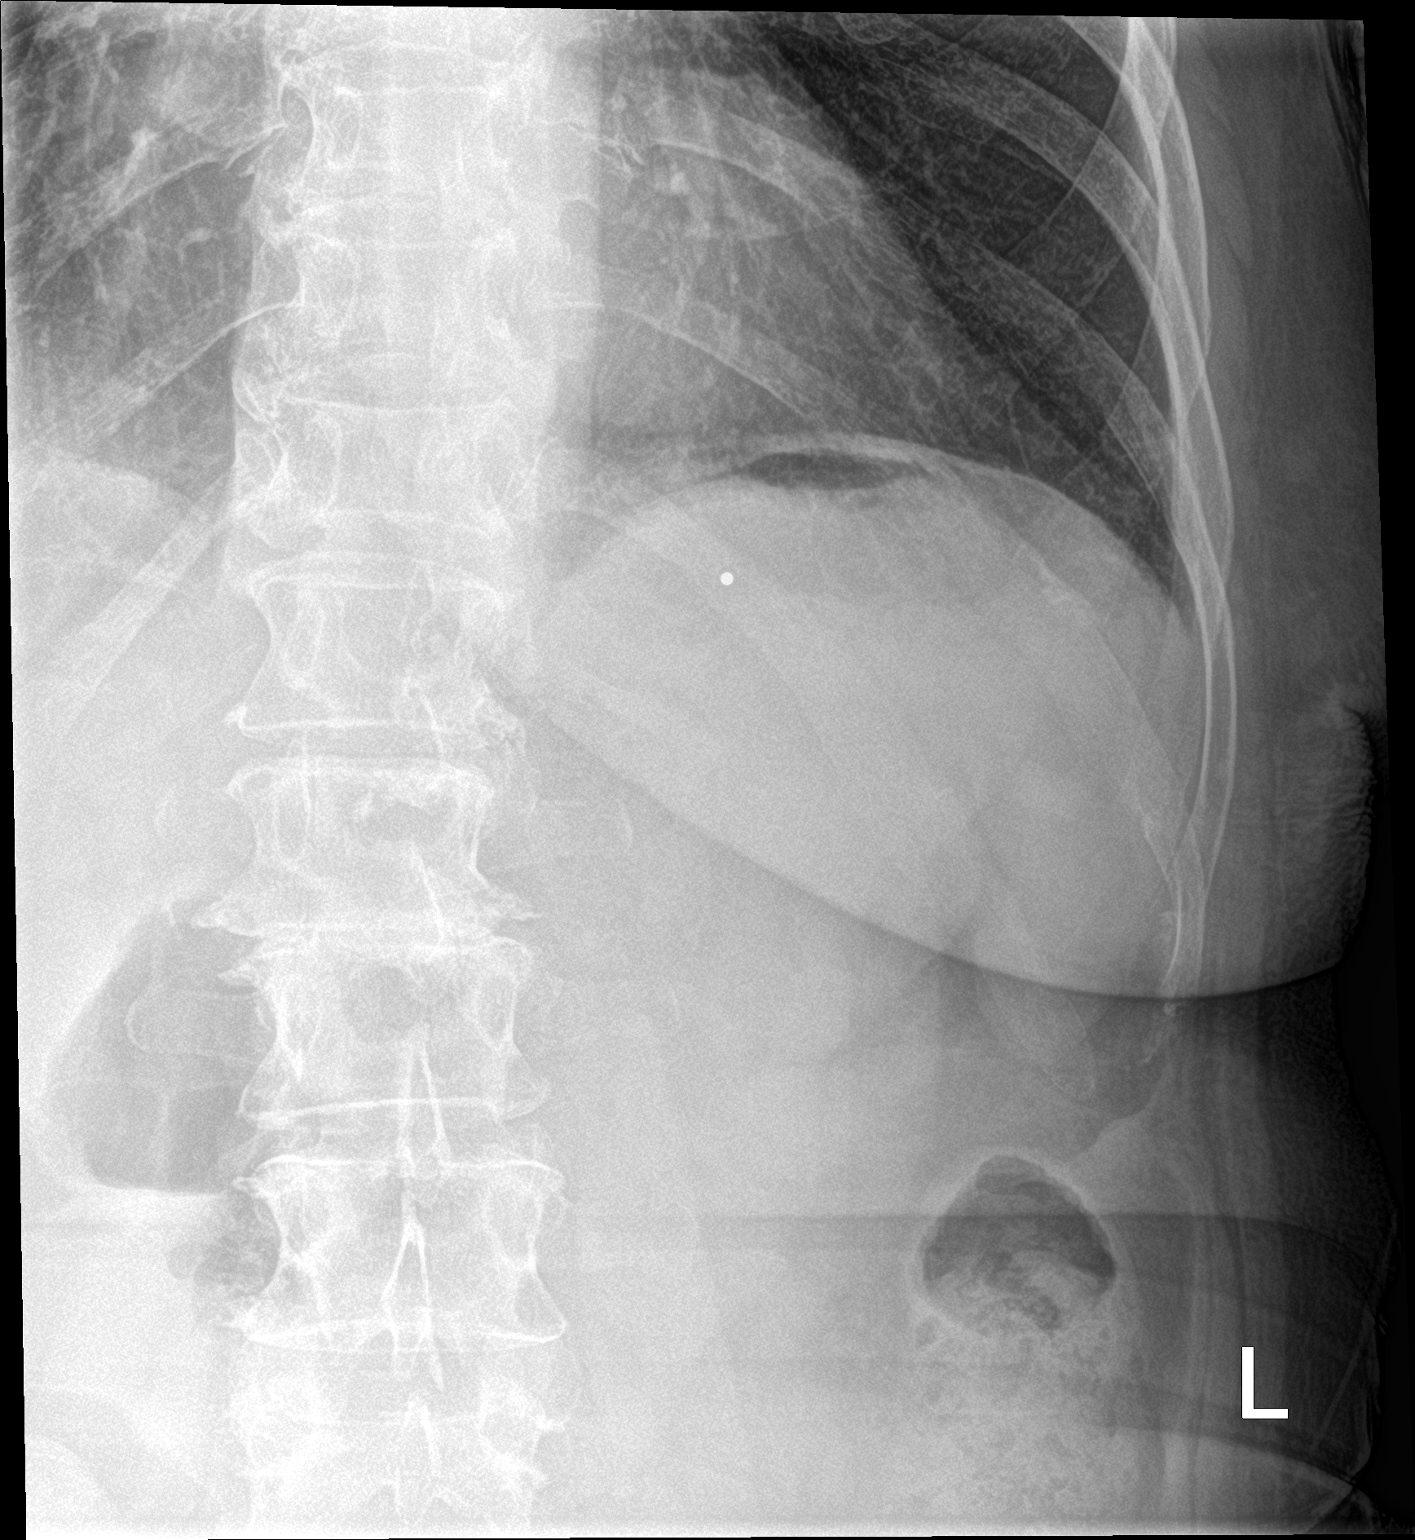

[rib pa obl]
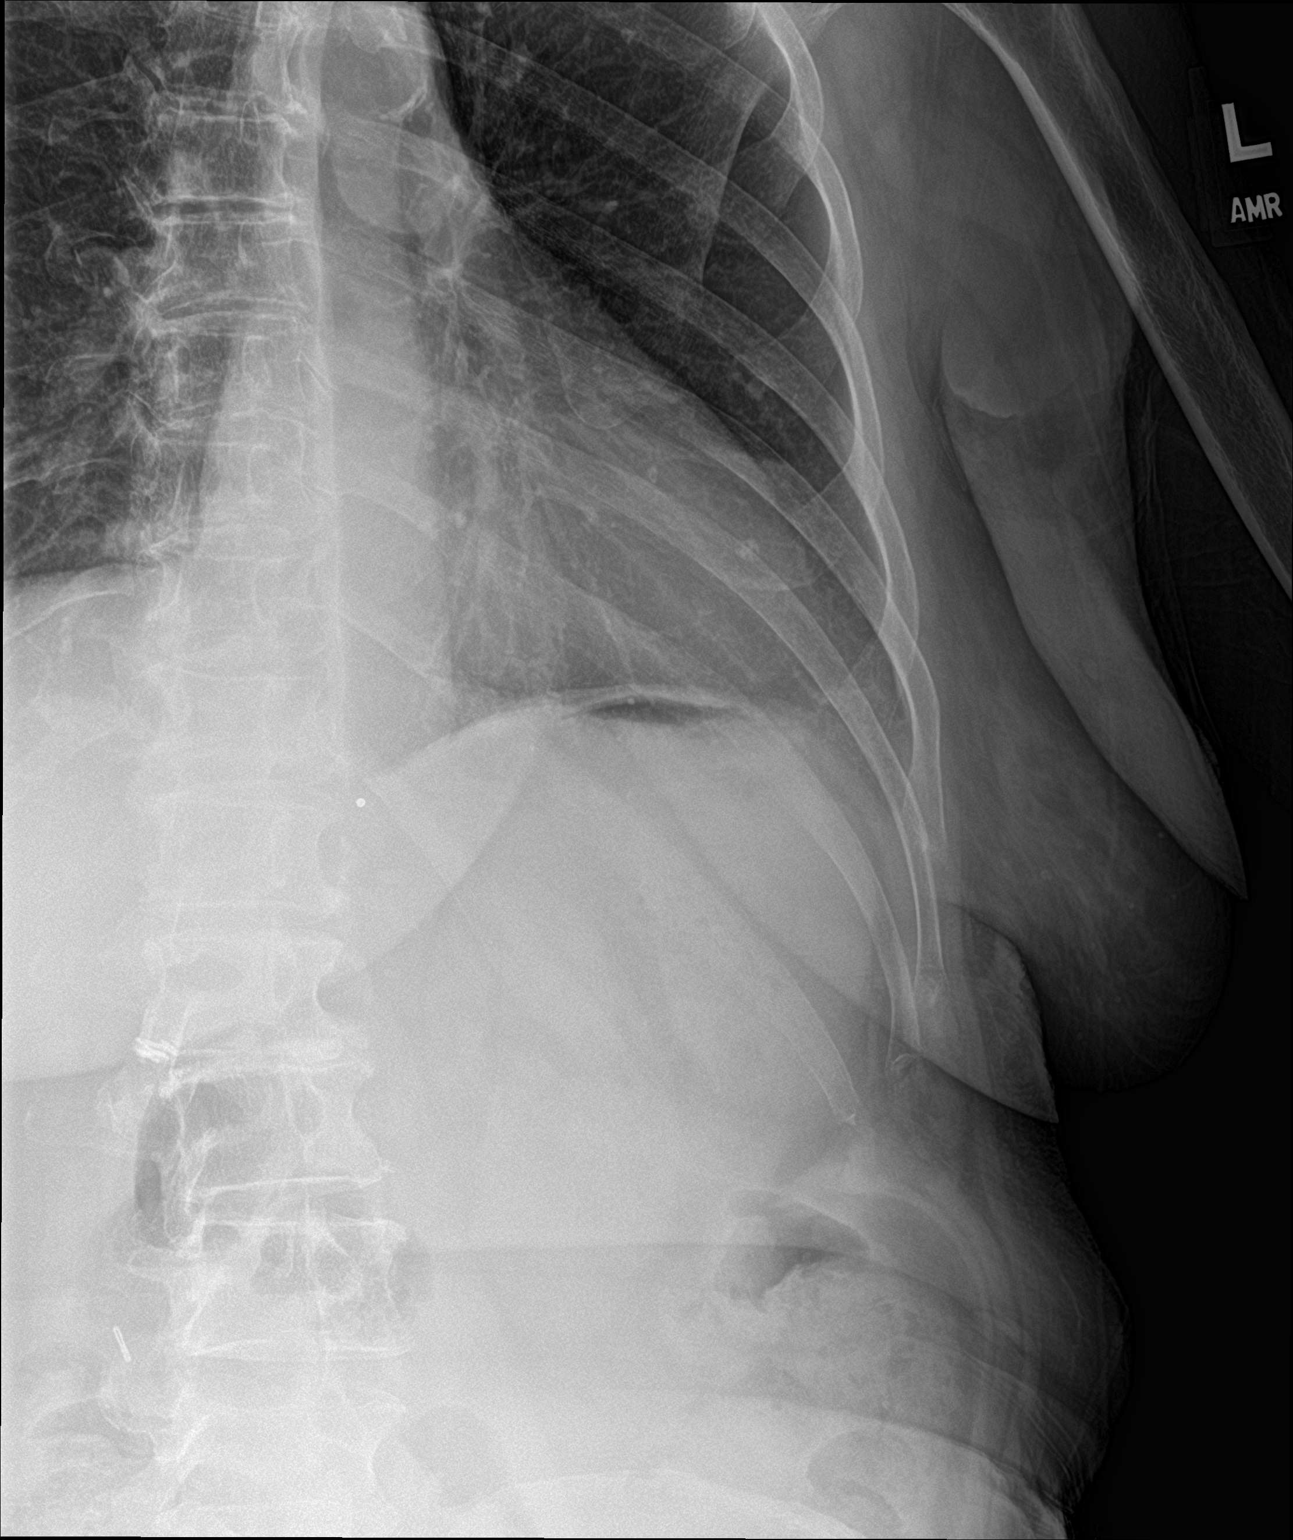

[3 of 3 positions shown; findings below may reference images not displayed]

FINDINGS: Frontal and oblique views of the left thoracic cage are obtained.
There are no acute displaced fractures. Mild diffuse thoracic
spondylosis. Visualized portions of the left chest are clear. No
effusion or pneumothorax.
IMPRESSION: 1. No acute displaced fracture.
2. Mild diffuse thoracic spondylosis.

## 2022-09-01 ENCOUNTER — Telehealth: Payer: Self-pay | Admitting: *Deleted

## 2022-09-01 NOTE — Chronic Care Management (AMB) (Signed)
  Care Coordination  Outreach Note  09/01/2022 Name: [name] MRN: [id number] DOB: [birth year]   Care Coordination Outreach Attempts: An unsuccessful telephone outreach was attempted today to offer the patient information about available care coordination services as a benefit of their health plan.   Follow Up Plan:  Additional outreach attempts will be made to offer the patient care coordination information and services.   Encounter Outcome:  No Answer  [place]  Direct Dial: [phone]

## 2022-09-07 NOTE — Chronic Care Management (AMB) (Signed)
  Care Coordination  Outreach Note  09/07/2022 Name: [name] MRN: [id number] DOB: 25-Dec-1945   Care Coordination Outreach Attempts: A second unsuccessful outreach was attempted today to offer the patient with information about available care coordination services as a benefit of their health plan.     Follow Up Plan:  Additional outreach attempts will be made to offer the patient care coordination information and services.   Encounter Outcome:  No Answer  [place]  Direct Dial: [phone]

## 2022-09-14 NOTE — Chronic Care Management (AMB) (Signed)
  Care Coordination   Note   09/14/2022 Name: [name] MRN: [id number] DOB: [birth year]  [name] is a 76 y.o. year old female who sees Redmond School, MD for primary care. I reached out to Lendell Caprice by phone today to offer care coordination services.  [name] was given information about Care Coordination services today including:   The Care Coordination services include support from the care team which includes your Nurse Coordinator, Clinical Social Worker, or Pharmacist.  The Care Coordination team is here to help remove barriers to the health concerns and goals most important to you. Care Coordination services are voluntary, and the patient may decline or stop services at any time by request to their care team member.   Care Coordination Consent Status: Patient did not agree to participate in care coordination services at this time.    Encounter Outcome:  Pt. Refused  [place]  Direct Dial: [phone]

## 2022-09-15 DIAGNOSIS — Z6825 Body mass index (BMI) 25.0-25.9, adult: Secondary | ICD-10-CM | POA: Diagnosis not present

## 2022-09-15 DIAGNOSIS — I10 Essential (primary) hypertension: Secondary | ICD-10-CM | POA: Diagnosis not present

## 2022-09-15 DIAGNOSIS — E782 Mixed hyperlipidemia: Secondary | ICD-10-CM | POA: Diagnosis not present

## 2022-09-15 DIAGNOSIS — Z0001 Encounter for general adult medical examination with abnormal findings: Secondary | ICD-10-CM | POA: Diagnosis not present

## 2022-09-15 DIAGNOSIS — E663 Overweight: Secondary | ICD-10-CM | POA: Diagnosis not present

## 2022-09-15 DIAGNOSIS — E1140 Type 2 diabetes mellitus with diabetic neuropathy, unspecified: Secondary | ICD-10-CM | POA: Diagnosis not present

## 2022-09-15 DIAGNOSIS — D518 Other vitamin B12 deficiency anemias: Secondary | ICD-10-CM | POA: Diagnosis not present

## 2022-09-15 DIAGNOSIS — E538 Deficiency of other specified B group vitamins: Secondary | ICD-10-CM | POA: Diagnosis not present

## 2022-09-15 DIAGNOSIS — E039 Hypothyroidism, unspecified: Secondary | ICD-10-CM | POA: Diagnosis not present

## 2022-09-15 DIAGNOSIS — E559 Vitamin D deficiency, unspecified: Secondary | ICD-10-CM | POA: Diagnosis not present

## 2022-09-15 DIAGNOSIS — Z1331 Encounter for screening for depression: Secondary | ICD-10-CM | POA: Diagnosis not present

## 2022-10-12 ENCOUNTER — Ambulatory Visit
Admission: RE | Admit: 2022-10-12 | Discharge: 2022-10-12 | Disposition: A | Discharge status: Home / Self Care | Payer: Medicare HMO | Source: Ambulatory Visit | Attending: Hematology and Oncology | Admitting: Hematology and Oncology

## 2022-10-12 DIAGNOSIS — Z1231 Encounter for screening mammogram for malignant neoplasm of breast: Secondary | ICD-10-CM | POA: Diagnosis not present

## 2022-11-25 DIAGNOSIS — D518 Other vitamin B12 deficiency anemias: Secondary | ICD-10-CM | POA: Diagnosis not present

## 2023-01-04 DIAGNOSIS — D518 Other vitamin B12 deficiency anemias: Secondary | ICD-10-CM | POA: Diagnosis not present

## 2023-02-07 DIAGNOSIS — D518 Other vitamin B12 deficiency anemias: Secondary | ICD-10-CM | POA: Diagnosis not present

## 2023-03-15 DIAGNOSIS — D518 Other vitamin B12 deficiency anemias: Secondary | ICD-10-CM | POA: Diagnosis not present

## 2023-04-21 DIAGNOSIS — E538 Deficiency of other specified B group vitamins: Secondary | ICD-10-CM | POA: Diagnosis not present

## 2023-05-30 DIAGNOSIS — D518 Other vitamin B12 deficiency anemias: Secondary | ICD-10-CM | POA: Diagnosis not present

## 2023-07-03 DIAGNOSIS — D518 Other vitamin B12 deficiency anemias: Secondary | ICD-10-CM | POA: Diagnosis not present

## 2023-08-03 DIAGNOSIS — E538 Deficiency of other specified B group vitamins: Secondary | ICD-10-CM | POA: Diagnosis not present

## 2023-09-04 DIAGNOSIS — E538 Deficiency of other specified B group vitamins: Secondary | ICD-10-CM | POA: Diagnosis not present

## 2023-09-07 ENCOUNTER — Other Ambulatory Visit: Payer: Self-pay | Admitting: Hematology and Oncology

## 2023-09-07 DIAGNOSIS — Z1231 Encounter for screening mammogram for malignant neoplasm of breast: Secondary | ICD-10-CM

## 2023-10-03 DIAGNOSIS — E663 Overweight: Secondary | ICD-10-CM | POA: Diagnosis not present

## 2023-10-03 DIAGNOSIS — Z6825 Body mass index (BMI) 25.0-25.9, adult: Secondary | ICD-10-CM | POA: Diagnosis not present

## 2023-10-03 DIAGNOSIS — E1165 Type 2 diabetes mellitus with hyperglycemia: Secondary | ICD-10-CM | POA: Diagnosis not present

## 2023-10-03 DIAGNOSIS — E538 Deficiency of other specified B group vitamins: Secondary | ICD-10-CM | POA: Diagnosis not present

## 2023-10-03 DIAGNOSIS — Z0001 Encounter for general adult medical examination with abnormal findings: Secondary | ICD-10-CM | POA: Diagnosis not present

## 2023-10-03 DIAGNOSIS — I10 Essential (primary) hypertension: Secondary | ICD-10-CM | POA: Diagnosis not present

## 2023-10-03 DIAGNOSIS — E782 Mixed hyperlipidemia: Secondary | ICD-10-CM | POA: Diagnosis not present

## 2023-10-03 DIAGNOSIS — D485 Neoplasm of uncertain behavior of skin: Secondary | ICD-10-CM | POA: Diagnosis not present

## 2023-10-03 DIAGNOSIS — D518 Other vitamin B12 deficiency anemias: Secondary | ICD-10-CM | POA: Diagnosis not present

## 2023-10-03 DIAGNOSIS — E039 Hypothyroidism, unspecified: Secondary | ICD-10-CM | POA: Diagnosis not present

## 2023-10-03 DIAGNOSIS — E1140 Type 2 diabetes mellitus with diabetic neuropathy, unspecified: Secondary | ICD-10-CM | POA: Diagnosis not present

## 2023-10-03 DIAGNOSIS — E559 Vitamin D deficiency, unspecified: Secondary | ICD-10-CM | POA: Diagnosis not present

## 2023-10-17 ENCOUNTER — Ambulatory Visit
Admission: RE | Admit: 2023-10-17 | Discharge: 2023-10-17 | Disposition: A | Discharge status: Home / Self Care | Payer: Medicare HMO | Source: Ambulatory Visit | Attending: Hematology and Oncology | Admitting: Hematology and Oncology

## 2023-10-17 DIAGNOSIS — Z1231 Encounter for screening mammogram for malignant neoplasm of breast: Secondary | ICD-10-CM

## 2023-11-03 DIAGNOSIS — E538 Deficiency of other specified B group vitamins: Secondary | ICD-10-CM | POA: Diagnosis not present

## 2023-11-23 DIAGNOSIS — Z08 Encounter for follow-up examination after completed treatment for malignant neoplasm: Secondary | ICD-10-CM | POA: Diagnosis not present

## 2023-11-23 DIAGNOSIS — L570 Actinic keratosis: Secondary | ICD-10-CM | POA: Diagnosis not present

## 2023-11-23 DIAGNOSIS — X32XXXD Exposure to sunlight, subsequent encounter: Secondary | ICD-10-CM | POA: Diagnosis not present

## 2023-11-23 DIAGNOSIS — Z85828 Personal history of other malignant neoplasm of skin: Secondary | ICD-10-CM | POA: Diagnosis not present

## 2023-12-25 DIAGNOSIS — D518 Other vitamin B12 deficiency anemias: Secondary | ICD-10-CM | POA: Diagnosis not present

## 2024-01-22 DIAGNOSIS — E538 Deficiency of other specified B group vitamins: Secondary | ICD-10-CM | POA: Diagnosis not present

## 2024-03-08 DIAGNOSIS — E538 Deficiency of other specified B group vitamins: Secondary | ICD-10-CM | POA: Diagnosis not present

## 2024-04-09 DIAGNOSIS — E538 Deficiency of other specified B group vitamins: Secondary | ICD-10-CM | POA: Diagnosis not present

## 2024-05-08 DIAGNOSIS — E538 Deficiency of other specified B group vitamins: Secondary | ICD-10-CM | POA: Diagnosis not present

## 2024-06-07 DIAGNOSIS — D518 Other vitamin B12 deficiency anemias: Secondary | ICD-10-CM | POA: Diagnosis not present

## 2024-06-23 ENCOUNTER — Ambulatory Visit
Admission: EM | Admit: 2024-06-23 | Discharge: 2024-06-23 | Disposition: A | Discharge status: Home / Self Care | Attending: Family Medicine | Admitting: Family Medicine

## 2024-06-23 DIAGNOSIS — S46912A Strain of unspecified muscle, fascia and tendon at shoulder and upper arm level, left arm, initial encounter: Secondary | ICD-10-CM

## 2024-06-23 MED ORDER — TIZANIDINE HCL 2 MG PO CAPS: 2.0000 mg | ORAL_CAPSULE | Freq: Three times a day (TID) | ORAL | 0 refills | Status: AC | PRN

## 2024-06-23 NOTE — ED Triage Notes (Signed)
 Pt presents to UC for c/o pain between shoulder blades that has progressed to left shoulder and left arm x6 days. Pt taking tylenol , ibuprofen, and pain cream. Denies lifting heavy, falling, or direct injury.

## 2024-06-23 NOTE — ED Provider Notes (Signed)
 [facility]    CSN: [id number] Arrival date & time: 06/23/24  1310      History   Chief Complaint No chief complaint on file.   HPI [name] is a 78 y.o. female.   Patient presenting today with 6-day history of left-sided upper back pain now rating down into her shoulder and upper left arm.  States the pain started upon waking 1 morning, no known injury prior to onset.  Worse with movement, pressure applied and improved with ibuprofen and muscle rubs.  Denies numbness, tingling, weakness, headaches, midline spinal pain.    Past Medical History:  Diagnosis Date   Arthritis    Breast cancer (HCC)    INVASIVE MAMMARY DX - 08/30/11   Cough    Diabetes mellitus    GERD (gastroesophageal reflux disease)    Hyperlipidemia    Personal history of chemotherapy    S/P radiation therapy 12/05/11 - 12/30/11   Right Breast/Right Breast Boost   Use of aromatase inhibitors 01/11/12   Arimidex  - Dr. Sharma Bathe    Patient Active Problem List   Diagnosis Date Noted   Osteopenia 11/03/2015   Breast cancer of upper-inner quadrant of right female breast (HCC) 09/07/2011    Past Surgical History:  Procedure Laterality Date   BREAST BIOPSY  110/16/12     RIGHT BREAST NEEDLE CORE BIOPSPY, MASS 1 O'CLOCK - INVASIVE MAMARY   BREAST BIOPSY Left 05/2019   benign   BREAST LUMPECTOMY  10/25/2011   RIGHT BREAST - INVASIVE DUCTAL, SENTINEL LYMPH BIOPSY,( 0/3) NODES NEGATIVE., ER+, PR+, LOW S-PHASE, HER 2 NEU - NO AMPLIFICATION   CHOLECYSTECTOMY     ERCP     CBD stone   OOPHORECTOMY     with a cyst removal   ovary removed     stone in bile duct      OB History   No obstetric history on file.      Home Medications    Prior to Admission medications   Medication Sig Start Date End Date Taking? Authorizing Provider  tizanidine  (ZANAFLEX ) 2 MG capsule Take 1 capsule (2 mg total) by mouth 3 (three) times daily as needed for muscle spasms. Do not drink alcohol or  drive while taking this medication.  May cause drowsiness. 06/23/24  Yes Stuart Vernell Norris, PA-C  ALPRAZolam (XANAX) 0.5 MG tablet Take 0.5 mg by mouth at bedtime as needed.  09/02/11   Lomax, Charles W, MD  Ascorbic Acid (VITAMIN C  PO) Take by mouth daily.    [provider]  aspirin 81 MG tablet Take 81 mg by mouth daily.      [provider]  atorvastatin (LIPITOR) 40 MG tablet Take 40 mg by mouth daily. 01/16/15   [provider]  cyanocobalamin  (,VITAMIN B-12,) 1000 MCG/ML injection Inject 1,000 mcg into the muscle every 30 (thirty) days.    [provider]  gabapentin  (NEURONTIN ) 300 MG capsule Take 3 capsules (900 mg total) by mouth daily. 01/25/22   Gudena, Vinay, MD  lisinopril (PRINIVIL,ZESTRIL) 2.5 MG tablet  05/19/18   [provider]  Meloxicam  15 MG TBDP Take 1 tablet by mouth daily. 01/18/21   Gudena, Vinay, MD  metFORMIN (GLUCOPHAGE-XR) 500 MG 24 hr tablet Take 500 mg by mouth 2 (two) times daily.  07/03/11   [provider]  Multiple Vitamins-Minerals (ZINC  PO) Take by mouth daily.    [provider]  ranitidine (ZANTAC) 75 MG tablet Take 75 mg by  mouth as needed for heartburn. Takes occasionally    [provider]  venlafaxine  (EFFEXOR ) 25 MG tablet TAKE 1 & 1/2 TABLETS BY MOUTH DAILY. 09/16/16   Odean Potts, MD  vitamin C  (ASCORBIC ACID) 250 MG tablet Take 1 tablet (250 mg total) by mouth daily. 01/18/21   Gudena, Vinay, MD  VITAMIN D  PO Take by mouth daily.    [provider]  zinc  gluconate 50 MG tablet Take 1 tablet (50 mg total) by mouth daily. 01/18/21   Odean Potts, MD    Family History Family History  Problem Relation Age of Onset   Heart failure Mother    Cirrhosis Father    Cancer Sister        breast   Breast cancer Sister    Breast cancer Daughter     Social History Social History   Tobacco Use   Smoking status: Never  Substance Use Topics   Alcohol use: No   Drug use: No      Allergies   Codeine   Review of Systems Review of Systems Per HPI  Physical Exam Triage Vital Signs ED Triage Vitals  Encounter Vitals Group     BP 06/23/24 1349 (!) 146/80     Girls Systolic BP Percentile --      Girls Diastolic BP Percentile --      Boys Systolic BP Percentile --      Boys Diastolic BP Percentile --      Pulse Rate 06/23/24 1349 77     Resp 06/23/24 1349 16     Temp 06/23/24 1349 98.1 F (36.7 C)     Temp Source 06/23/24 1349 Oral     SpO2 06/23/24 1349 95 %     Weight --      Height --      Head Circumference --      Peak Flow --      Pain Score 06/23/24 1348 6     Pain Loc --      Pain Education --      Exclude from Growth Chart --    No data found.  Updated Vital Signs BP (!) 146/80 (BP Location: Right Arm)   Pulse 77   Temp 98.1 F (36.7 C) (Oral)   Resp 16   SpO2 95%   Visual Acuity Right Eye Distance:   Left Eye Distance:   Bilateral Distance:    Right Eye Near:   Left Eye Near:    Bilateral Near:     Physical Exam Vitals and nursing note reviewed.  Constitutional:      Appearance: Normal appearance. She is not ill-appearing.  HENT:     Head: Atraumatic.     Mouth/Throat:     Mouth: Mucous membranes are moist.  Eyes:     Extraocular Movements: Extraocular movements intact.     Conjunctiva/sclera: Conjunctivae normal.  Cardiovascular:     Rate and Rhythm: Normal rate and regular rhythm.     Heart sounds: Normal heart sounds.  Pulmonary:     Effort: Pulmonary effort is normal.     Breath sounds: Normal breath sounds.  Musculoskeletal:        General: Tenderness present. No swelling. Normal range of motion.     Cervical back: Normal range of motion and neck supple.     Comments: No midline spinal tenderness to palpation diffusely.  Tenderness to palpation to the left upper back and shoulder diffusely, no bony deformity palpable and range of motion  intact  Skin:    General: Skin is warm and dry.     Findings: No  bruising or erythema.  Neurological:     Mental Status: She is alert and oriented to person, place, and time.     Motor: No weakness.     Gait: Gait normal.     Comments: Bilateral upper extremities neurovascularly intact  Psychiatric:        Mood and Affect: Mood normal.        Thought Content: Thought content normal.        Judgment: Judgment normal.      UC Treatments / Results  Labs (all labs ordered are listed, but only abnormal results are displayed) Labs Reviewed - No data to display  EKG   Radiology No results found.  Procedures Procedures (including critical care time)  Medications Ordered in UC Medications - No data to display  Initial Impression / Assessment and Plan / UC Course  I have reviewed the triage vital signs and the nursing notes.  Pertinent labs & imaging results that were available during my care of the patient were reviewed by me and considered in my medical decision making (see chart for details).     Suspect shoulder strain, treat with Zanaflex , heat,/, stretches, over-the-counter pain relievers.  Return for worsening symptoms.  No red flag findings today.  Final Clinical Impressions(s) / UC Diagnoses   Final diagnoses:  Strain of left shoulder, initial encounter     Discharge Instructions      In addition to ibuprofen and Tylenol , you may use the muscle relaxers as needed, apply heat, muscle rubs, stretches, massage    ED Prescriptions     Medication Sig Dispense Auth. Provider   tizanidine  (ZANAFLEX ) 2 MG capsule Take 1 capsule (2 mg total) by mouth 3 (three) times daily as needed for muscle spasms. Do not drink alcohol or drive while taking this medication.  May cause drowsiness. 15 capsule Stuart Vernell Norris, [place]      PDMP not reviewed this encounter.   Stuart Vernell Norris, [place] 06/23/24 1534

## 2024-06-23 NOTE — Discharge Instructions (Signed)
 In addition to ibuprofen and Tylenol , you may use the muscle relaxers as needed, apply heat, muscle rubs, stretches, massage

## 2024-07-09 DIAGNOSIS — E538 Deficiency of other specified B group vitamins: Secondary | ICD-10-CM | POA: Diagnosis not present

## 2024-09-04 ENCOUNTER — Other Ambulatory Visit: Payer: Self-pay | Admitting: Family Medicine

## 2024-09-04 DIAGNOSIS — Z1231 Encounter for screening mammogram for malignant neoplasm of breast: Secondary | ICD-10-CM

## 2024-10-18 ENCOUNTER — Encounter

## 2024-11-12 ENCOUNTER — Ambulatory Visit
Admission: RE | Admit: 2024-11-12 | Discharge: 2024-11-12 | Disposition: A | Discharge status: Home / Self Care | Source: Ambulatory Visit | Attending: Family Medicine | Admitting: Family Medicine

## 2024-11-12 DIAGNOSIS — Z1231 Encounter for screening mammogram for malignant neoplasm of breast: Secondary | ICD-10-CM
